# Patient Record
Sex: Male | Born: 1970 | Hispanic: No | Marital: Married | Smoking: Never smoker
Health system: Southern US, Community
[De-identification: ages and names within clinical notes are randomized; demographics above are authoritative.]

## PROBLEM LIST (undated history)

## (undated) DIAGNOSIS — I10 Essential (primary) hypertension: Secondary | ICD-10-CM

---

## 2005-03-17 ENCOUNTER — Observation Stay (HOSPITAL_COMMUNITY): Admission: EM | Admit: 2005-03-17 | Discharge: 2005-03-18 | Payer: Self-pay | Admitting: Emergency Medicine

## 2005-03-20 ENCOUNTER — Emergency Department (HOSPITAL_COMMUNITY): Admission: EM | Admit: 2005-03-20 | Discharge: 2005-03-20 | Payer: Self-pay | Admitting: Emergency Medicine

## 2005-05-09 ENCOUNTER — Ambulatory Visit (HOSPITAL_COMMUNITY): Admission: RE | Admit: 2005-05-09 | Discharge: 2005-05-09 | Payer: Self-pay | Admitting: Urology

## 2005-08-22 ENCOUNTER — Emergency Department (HOSPITAL_COMMUNITY): Admission: EM | Admit: 2005-08-22 | Discharge: 2005-08-22 | Payer: Self-pay | Admitting: Emergency Medicine

## 2006-09-26 ENCOUNTER — Ambulatory Visit: Payer: Self-pay | Admitting: Family Medicine

## 2006-09-27 ENCOUNTER — Ambulatory Visit: Payer: Self-pay | Admitting: *Deleted

## 2007-11-12 ENCOUNTER — Emergency Department (HOSPITAL_COMMUNITY): Admission: EM | Admit: 2007-11-12 | Discharge: 2007-11-12 | Payer: Self-pay | Admitting: Emergency Medicine

## 2008-09-15 ENCOUNTER — Emergency Department (HOSPITAL_COMMUNITY): Admission: EM | Admit: 2008-09-15 | Discharge: 2008-09-16 | Payer: Self-pay | Admitting: Emergency Medicine

## 2009-04-17 ENCOUNTER — Emergency Department (HOSPITAL_COMMUNITY): Admission: EM | Admit: 2009-04-17 | Discharge: 2009-04-18 | Payer: Self-pay | Admitting: Emergency Medicine

## 2009-10-16 ENCOUNTER — Emergency Department (HOSPITAL_COMMUNITY): Admission: EM | Admit: 2009-10-16 | Discharge: 2009-10-16 | Payer: Self-pay | Admitting: Emergency Medicine

## 2010-03-06 ENCOUNTER — Emergency Department (HOSPITAL_COMMUNITY): Admission: EM | Admit: 2010-03-06 | Discharge: 2010-03-06 | Payer: Self-pay | Admitting: Emergency Medicine

## 2010-03-21 ENCOUNTER — Ambulatory Visit (HOSPITAL_COMMUNITY): Admission: RE | Admit: 2010-03-21 | Discharge: 2010-03-21 | Payer: Self-pay | Admitting: Urology

## 2010-06-05 HISTORY — PX: PERCUTANEOUS NEPHROSTOLITHOTOMY: SHX2207

## 2010-06-25 ENCOUNTER — Encounter: Payer: Self-pay | Admitting: Urology

## 2010-08-17 LAB — SURGICAL PCR SCREEN
MRSA, PCR: NEGATIVE
Staphylococcus aureus: NEGATIVE

## 2010-08-18 LAB — URINALYSIS, ROUTINE W REFLEX MICROSCOPIC
Bilirubin Urine: NEGATIVE
Glucose, UA: NEGATIVE mg/dL
Ketones, ur: NEGATIVE mg/dL
Nitrite: NEGATIVE
Protein, ur: 30 mg/dL — AB
Specific Gravity, Urine: 1.014 (ref 1.005–1.030)
Urobilinogen, UA: 0.2 mg/dL (ref 0.0–1.0)
pH: 6.5 (ref 5.0–8.0)

## 2010-08-18 LAB — URINE MICROSCOPIC-ADD ON

## 2010-08-18 LAB — CBC
HCT: 43 % (ref 39.0–52.0)
Hemoglobin: 14.8 g/dL (ref 13.0–17.0)
MCH: 28.7 pg (ref 26.0–34.0)
MCHC: 34.4 g/dL (ref 30.0–36.0)
MCV: 83.3 fL (ref 78.0–100.0)
Platelets: 147 10*3/uL — ABNORMAL LOW (ref 150–400)
RBC: 5.16 MIL/uL (ref 4.22–5.81)
RDW: 12.9 % (ref 11.5–15.5)
WBC: 8.1 10*3/uL (ref 4.0–10.5)

## 2010-08-18 LAB — BASIC METABOLIC PANEL
BUN: 12 mg/dL (ref 6–23)
CO2: 27 mEq/L (ref 19–32)
Calcium: 8.9 mg/dL (ref 8.4–10.5)
Chloride: 106 mEq/L (ref 96–112)
Creatinine, Ser: 0.9 mg/dL (ref 0.4–1.5)
GFR calc Af Amer: >60 mL/min (ref >60)
GFR calc non Af Amer: >60 mL/min (ref >60)
Glucose, Bld: 90 mg/dL (ref 70–99)
Potassium: 3.6 mEq/L (ref 3.5–5.1)
Sodium: 138 mEq/L (ref 135–145)

## 2010-08-18 LAB — URINE CULTURE
Colony Count: NO GROWTH
Culture  Setup Time: 201110021517
Culture: NO GROWTH

## 2010-08-18 LAB — DIFFERENTIAL
Basophils Absolute: 0 10*3/uL (ref 0.0–0.1)
Basophils Relative: 1 % (ref 0–1)
Eosinophils Absolute: 0.5 10*3/uL (ref 0.0–0.7)
Eosinophils Relative: 6 % — ABNORMAL HIGH (ref 0–5)
Lymphocytes Relative: 27 % (ref 12–46)
Lymphs Abs: 2.2 10*3/uL (ref 0.7–4.0)
Monocytes Absolute: 0.6 10*3/uL (ref 0.1–1.0)
Monocytes Relative: 8 % (ref 3–12)
Neutro Abs: 4.8 10*3/uL (ref 1.7–7.7)
Neutrophils Relative %: 59 % (ref 43–77)

## 2010-08-23 LAB — URINE CULTURE
Colony Count: NO GROWTH
Culture: NO GROWTH

## 2010-08-23 LAB — URINALYSIS, ROUTINE W REFLEX MICROSCOPIC
Bilirubin Urine: NEGATIVE
Glucose, UA: NEGATIVE mg/dL
Ketones, ur: NEGATIVE mg/dL
Nitrite: NEGATIVE
Protein, ur: 30 mg/dL — AB
Specific Gravity, Urine: 1.022 (ref 1.005–1.030)
Urobilinogen, UA: 0.2 mg/dL (ref 0.0–1.0)
pH: 6 (ref 5.0–8.0)

## 2010-08-23 LAB — URINE MICROSCOPIC-ADD ON

## 2010-08-23 LAB — DIFFERENTIAL
Basophils Absolute: 0 10*3/uL (ref 0.0–0.1)
Basophils Relative: 0 % (ref 0–1)
Neutro Abs: 4.7 10*3/uL (ref 1.7–7.7)
Neutrophils Relative %: 60 % (ref 43–77)

## 2010-08-23 LAB — POCT I-STAT, CHEM 8
Calcium, Ion: 1.1 mmol/L — ABNORMAL LOW (ref 1.12–1.32)
Chloride: 106 mEq/L (ref 96–112)
HCT: 47 % (ref 39.0–52.0)
Hemoglobin: 16 g/dL (ref 13.0–17.0)
Potassium: 4 mEq/L (ref 3.5–5.1)

## 2010-08-23 LAB — CBC
MCHC: 34.3 g/dL (ref 30.0–36.0)
RDW: 13.1 % (ref 11.5–15.5)

## 2010-09-07 LAB — URINALYSIS, ROUTINE W REFLEX MICROSCOPIC
Ketones, ur: NEGATIVE mg/dL
Nitrite: NEGATIVE
Specific Gravity, Urine: 1.011 (ref 1.005–1.030)
Urobilinogen, UA: 0.2 mg/dL (ref 0.0–1.0)
pH: 6.5 (ref 5.0–8.0)

## 2010-09-07 LAB — POCT I-STAT, CHEM 8
BUN: 9 mg/dL (ref 6–23)
Creatinine, Ser: 0.9 mg/dL (ref 0.4–1.5)
Glucose, Bld: 90 mg/dL (ref 70–99)
Hemoglobin: 16 g/dL (ref 13.0–17.0)
Potassium: 3.8 mEq/L (ref 3.5–5.1)

## 2010-09-07 LAB — URINE CULTURE
Colony Count: NO GROWTH
Culture: NO GROWTH

## 2010-09-07 LAB — URINE MICROSCOPIC-ADD ON

## 2010-10-21 NOTE — H&P (Signed)
NAMEMarland Kitchen  ROC, STREETT       ACCOUNT NO.:  [id number]   MEDICAL RECORD NO.:  [id number]          PATIENT TYPE:  EMS   LOCATION:  MAJO                         FACILITY:  [facility]   PHYSICIAN:  Bertram Millard. Dahlstedt, M.D.DATE OF BIRTH:  01/21/1971   DATE OF ADMISSION:  03/17/2005  DATE OF DISCHARGE:                                HISTORY & PHYSICAL   BRIEF HISTORY:  This 40 year old [place] man, in the [place] for four  months, with no prior knowledge of kidney stones, who is admitted at this  time for bilateral urolithiasis and UTI.  He has been symptomatic for a few  days with dysuria, frequency, and urgency.  He has had intermittent gross  hematuria for a month or two.  He has had intermittent abdominal pain for  the past several weeks to months which has actually intensified recently and  over the past day or two has been fairly significant.  He has had no  discrete fevers.  He has anterior and posterior abdominal pain.  He came to  the emergency room at [facility] where he was diagnosed with UTI  and an abdominal scan revealed a large right staghorn stone, hydronephrosis,  and parenchymal thinning on the right, and a left ureteral stone, distally,  about 5-to-6-mm in size.  Urologic consultation is requested.   PAST MEDICAL HISTORY:  Significant for some sort of intestinal issue in the  past.  He really did not see a doctor regularly in [place].  He denies any  prior knowledge of kidney stones.  He perhaps has had an infection in the  past.   He denies any prior surgical illnesses.   He is not on any medications.   He denies any drug allergies.   He is married and has his first child on the way.  He denies tobacco or  alcohol use.  He works for the [employer] in [employer].   FAMILY HISTORY:  Unremarkable.  His parents are both living and in [place].  He  denies any knowledge of them having heart disease, hypertension, diabetes,  or other  issues.   He denies any longstanding history of gross hematuria, perhaps over the past  month or two.  He denies a slow stream.  He does have some dysuria and  frequency.  He has not had fever.  He has not had nausea.  He does not have  vomiting.  He does not have weight loss.  He denies any shortness of breath.  He has some upper abdominal discomfort which has been diagnosed as  intestinal issues in the past.  He has been on some medicine for that.   PHYSICAL EXAMINATION:  GENERAL:  Pleasant adult male.  He did not speak  [place] but had an interpreter with him who was a relative.  HEENT:  Normal.  NECK:  Supple without thyromegaly or adenopathy.  CHEST: Clear to auscultation.  HEART:  Normal rate and rhythm.  ABDOMEN:  Flat, soft, nondistended, moderate tenderness throughout the  anterior abdomen all quadrants.  No rebound, guarding.  No  hepatosplenomegaly or masses noted.  He had mild  bilateral CVA tenderness.  There were no flank masses.  GENITOURINARY:  Phallus was circumcised.  No lesions.  No fibrotic areas of  plaques.  Glans normal.  Meatus normal in location and size.  No blood or  discharge.  Scrotal skin normal.  Testicles normal in shape, size, and  consistency, descended bilaterally.  Cords and epididymidal structures  normal.  No inguinal hernias.  No peripheral edema.  He had normal DP and PT  pulses in both feet.  SKIN:  Warm and dry.  NEUROLOGIC:  He was alert and oriented .   LABORATORY STUDIES:  A urinalysis was infected.  Hematocrit 39.7%, platelet  count 176,000, white count 5200.  BUN creatinine were 6 and 1.1,  respectively.  Sodium 139, potassium 4.2, chloride 107, bicarb 29, BUN 16,  glucose 88.   I have reviewed the patient's CT scan.  This shows a very large staghorn  right renal calculus with parenchymal thinning throughout.  There perhaps  was some fair parenchyma noted.  He had normal-appearing left kidney with  minimal dilatation of the ureter down  to a distal left ureteral stone which  was 5-to-6-mm in size.  I saw no other abnormalities.   IMPRESSION:  1.  Large right renal calculus/staghorn.  2.  Parenchymal thinning/atrophy right kidney.  3.  Urinary tract infection, perhaps by urea-splitting organism.  4.  Left ureteral calculus.   PLAN:  1.  I will admit the patient tonight and place him on Cipro 400 mg IV q.12h.  2.  I have discussed at length the treatment of the patient's stones.  It is      most important at this point that we un obstruct his good kidney.  I      will take him to the OR tomorrow and place a stent in that left kidney.      If there is an easy stone extraction, I might attempt that.  3.  The patient will eventually need functional studies of the right kidney.      If he has splits less than 15% or so, I think it is worthwhile to plan      on removing the kidney.  If he has got fair function, I think it is      worthwhile to consider percutaneous nephrolithotomy.      Bertram Millard. Dahlstedt, M.D.  Electronically Signed     SMD/MEDQ  D:  03/17/2005  T:  03/17/2005  Job:  [id number]

## 2010-10-21 NOTE — Op Note (Signed)
Paul Kitchen  Ayers, Paul Ayers       ACCOUNT NO.:  [id number]   MEDICAL RECORD NO.:  [id number]          PATIENT TYPE:  OUT   LOCATION:  DAY                          FACILITY:  [facility]   PHYSICIAN:  Bertram Millard. Dahlstedt, M.D.DATE OF BIRTH:  15-Apr-1971   DATE OF PROCEDURE:  DATE OF DISCHARGE:                                 OPERATIVE REPORT   PREOPERATIVE DIAGNOSES:  1.  Urinary tract infection, most likely due to urea-splitting organism.  2.  Left ureteral stone.  3.  Right staghorn stone.   PRINCIPAL PROCEDURES:  Cysto, left double-J stent placement.   ANESTHESIA:  General.   SURGEON:  Dr. Retta Diones.   COMPLICATIONS:  None.   BRIEF HISTORY:  A 40 year old Paul Ayers who presented last night to the  [facility] ER with several-day history of abdominal pain, flank pain, some  low grade fever. He has had intermittent symptoms for quite some time. Due  to language difficulties, it was hard to communicate specifically, although  he does he did have an interpreter.   Evaluation found the patient have a very large staghorn stone on the right,  left ureteral stone in the mid-to-distal ureter on the left, and a urinary  tract infection. Although he was not febrile, the patient was admitted. He  presents at this time for stent placement on the left to relieve any  obstruction, as well as antibiotic management.   I spoke with the patient's family at length. Information was given regarding  the severe nature of his right renal stone, the need for decompression of  the left renal unit, and antibiotic management. I thought it best at this  point to just perform cysto and stent placement as the patient does have an  active UTI, and ureteroscopy would not be warranted at this point due to its  increased risk of sepsis. He presents at this time for left double-J stent  placement.   DESCRIPTION OF PROCEDURE:  The patient had been administered preoperative IV  antibiotics, and he was taken to  the operating room where general anesthetic  was administered. Placed in dorsal lithotomy position. Genitalia and  perineum were prepped and draped. A 22-French panendoscope was advanced into  his bladder. Urethra was normal. Bladder was inspected circumferentially. It  was unremarkable. The left ureter was cannulated with a guidewire which was  advanced under fluoroscopic guidance into the left renal pelvis. Over top of  this, a 6-French x 24 cm double-J stent was placed. Good curls were seen  proximally and distally once the guidewire was removed.   I did take a plain film of the patient's abdomen, showing the large staghorn  stone on the right. I did not see any evidence of calculi along the course  of that right ureter. For this reason, a stent was not placed on that side.   At this point, the bladder was drained, the scope removed and procedure  terminated.   The patient tolerated the procedure well. He was awakened, extubated, taken  to PACU in stable condition.      Bertram Millard. Dahlstedt, M.D.  Electronically Signed     SMD/MEDQ  D:  03/18/2005  T:  03/18/2005  Job:  [id number]

## 2011-03-02 LAB — URINALYSIS, ROUTINE W REFLEX MICROSCOPIC
Nitrite: NEGATIVE
Protein, ur: NEGATIVE
Specific Gravity, Urine: 1.017
Urobilinogen, UA: 0.2

## 2011-03-02 LAB — URINE MICROSCOPIC-ADD ON

## 2011-03-02 LAB — POCT I-STAT, CHEM 8
Creatinine, Ser: 1.3
HCT: 45
Hemoglobin: 15.3
Potassium: 3.8
Sodium: 141
TCO2: 28

## 2011-03-02 LAB — URINE CULTURE
Colony Count: NO GROWTH
Culture: NO GROWTH

## 2014-12-08 DIAGNOSIS — Z87442 Personal history of urinary calculi: Secondary | ICD-10-CM | POA: Insufficient documentation

## 2016-05-25 DIAGNOSIS — M62830 Muscle spasm of back: Secondary | ICD-10-CM | POA: Insufficient documentation

## 2017-11-09 DIAGNOSIS — E119 Type 2 diabetes mellitus without complications: Secondary | ICD-10-CM | POA: Insufficient documentation

## 2017-11-09 DIAGNOSIS — I10 Essential (primary) hypertension: Secondary | ICD-10-CM | POA: Diagnosis present

## 2017-11-09 DIAGNOSIS — R748 Abnormal levels of other serum enzymes: Secondary | ICD-10-CM | POA: Insufficient documentation

## 2018-11-01 DIAGNOSIS — N200 Calculus of kidney: Secondary | ICD-10-CM | POA: Insufficient documentation

## 2018-11-02 DIAGNOSIS — E785 Hyperlipidemia, unspecified: Secondary | ICD-10-CM | POA: Insufficient documentation

## 2019-02-19 ENCOUNTER — Other Ambulatory Visit: Payer: Self-pay

## 2019-02-19 DIAGNOSIS — Z20822 Contact with and (suspected) exposure to covid-19: Secondary | ICD-10-CM

## 2019-02-20 LAB — NOVEL CORONAVIRUS, NAA: SARS-CoV-2, NAA: NOT DETECTED

## 2019-05-06 ENCOUNTER — Other Ambulatory Visit: Payer: Self-pay

## 2019-05-06 DIAGNOSIS — Z20822 Contact with and (suspected) exposure to covid-19: Secondary | ICD-10-CM

## 2019-05-08 LAB — NOVEL CORONAVIRUS, NAA: SARS-CoV-2, NAA: NOT DETECTED

## 2019-06-13 ENCOUNTER — Ambulatory Visit: Payer: Medicaid Other | Attending: Internal Medicine

## 2019-06-13 DIAGNOSIS — Z20822 Contact with and (suspected) exposure to covid-19: Secondary | ICD-10-CM

## 2019-06-14 LAB — NOVEL CORONAVIRUS, NAA: SARS-CoV-2, NAA: NOT DETECTED

## 2021-02-17 ENCOUNTER — Observation Stay (HOSPITAL_COMMUNITY)
Admission: EM | Admit: 2021-02-17 | Discharge: 2021-02-18 | Disposition: A | Discharge status: Home / Self Care | Payer: BC Managed Care – PPO | Attending: Family Medicine | Admitting: Family Medicine

## 2021-02-17 ENCOUNTER — Emergency Department (HOSPITAL_COMMUNITY): Payer: BC Managed Care – PPO

## 2021-02-17 ENCOUNTER — Other Ambulatory Visit: Payer: Self-pay

## 2021-02-17 ENCOUNTER — Encounter (HOSPITAL_COMMUNITY): Payer: Self-pay | Admitting: Internal Medicine

## 2021-02-17 DIAGNOSIS — Z79899 Other long term (current) drug therapy: Secondary | ICD-10-CM | POA: Insufficient documentation

## 2021-02-17 DIAGNOSIS — R0789 Other chest pain: Principal | ICD-10-CM

## 2021-02-17 DIAGNOSIS — Y900 Blood alcohol level of less than 20 mg/100 ml: Secondary | ICD-10-CM | POA: Diagnosis not present

## 2021-02-17 DIAGNOSIS — I10 Essential (primary) hypertension: Secondary | ICD-10-CM | POA: Diagnosis not present

## 2021-02-17 DIAGNOSIS — N179 Acute kidney failure, unspecified: Secondary | ICD-10-CM | POA: Diagnosis not present

## 2021-02-17 DIAGNOSIS — I771 Stricture of artery: Secondary | ICD-10-CM | POA: Diagnosis present

## 2021-02-17 DIAGNOSIS — K4090 Unilateral inguinal hernia, without obstruction or gangrene, not specified as recurrent: Secondary | ICD-10-CM

## 2021-02-17 DIAGNOSIS — R4020 Unspecified coma: Secondary | ICD-10-CM

## 2021-02-17 DIAGNOSIS — Z20822 Contact with and (suspected) exposure to covid-19: Secondary | ICD-10-CM | POA: Insufficient documentation

## 2021-02-17 DIAGNOSIS — R109 Unspecified abdominal pain: Secondary | ICD-10-CM

## 2021-02-17 DIAGNOSIS — R079 Chest pain, unspecified: Secondary | ICD-10-CM | POA: Diagnosis present

## 2021-02-17 DIAGNOSIS — K551 Chronic vascular disorders of intestine: Secondary | ICD-10-CM | POA: Diagnosis present

## 2021-02-17 DIAGNOSIS — R55 Syncope and collapse: Secondary | ICD-10-CM | POA: Diagnosis not present

## 2021-02-17 DIAGNOSIS — I774 Celiac artery compression syndrome: Secondary | ICD-10-CM | POA: Diagnosis present

## 2021-02-17 HISTORY — DX: Essential (primary) hypertension: I10

## 2021-02-17 LAB — CBC WITH DIFFERENTIAL/PLATELET
Abs Immature Granulocytes: 0.05 10*3/uL (ref 0.00–0.07)
Basophils Absolute: 0.1 10*3/uL (ref 0.0–0.1)
Basophils Relative: 1 %
Eosinophils Absolute: 0.3 10*3/uL (ref 0.0–0.5)
Eosinophils Relative: 4 %
HCT: 45.8 % (ref 39.0–52.0)
Hemoglobin: 15.5 g/dL (ref 13.0–17.0)
Immature Granulocytes: 1 %
Lymphocytes Relative: 31 %
Lymphs Abs: 2.9 10*3/uL (ref 0.7–4.0)
MCH: 28.6 pg (ref 26.0–34.0)
MCHC: 33.8 g/dL (ref 30.0–36.0)
MCV: 84.5 fL (ref 80.0–100.0)
Monocytes Absolute: 0.8 10*3/uL (ref 0.1–1.0)
Monocytes Relative: 9 %
Neutro Abs: 5.3 10*3/uL (ref 1.7–7.7)
Neutrophils Relative %: 54 %
Platelets: 170 10*3/uL (ref 150–400)
RBC: 5.42 MIL/uL (ref 4.22–5.81)
RDW: 12 % (ref 11.5–15.5)
WBC: 9.5 10*3/uL (ref 4.0–10.5)
nRBC: 0 % (ref 0.0–0.2)

## 2021-02-17 LAB — COMPREHENSIVE METABOLIC PANEL
ALT: 56 U/L — ABNORMAL HIGH (ref 0–44)
AST: 27 U/L (ref 15–41)
Albumin: 3.6 g/dL (ref 3.5–5.0)
Alkaline Phosphatase: 57 U/L (ref 38–126)
Anion gap: 8 (ref 5–15)
BUN: 16 mg/dL (ref 6–20)
CO2: 28 mmol/L (ref 22–32)
Calcium: 8.8 mg/dL — ABNORMAL LOW (ref 8.9–10.3)
Chloride: 100 mmol/L (ref 98–111)
Creatinine, Ser: 1.28 mg/dL — ABNORMAL HIGH (ref 0.61–1.24)
GFR, Estimated: >60 mL/min (ref >60)
Glucose, Bld: 162 mg/dL — ABNORMAL HIGH (ref 70–99)
Potassium: 3.5 mmol/L (ref 3.5–5.1)
Sodium: 136 mmol/L (ref 135–145)
Total Bilirubin: 0.9 mg/dL (ref 0.3–1.2)
Total Protein: 6.9 g/dL (ref 6.5–8.1)

## 2021-02-17 LAB — URINALYSIS, ROUTINE W REFLEX MICROSCOPIC
Bilirubin Urine: NEGATIVE
Glucose, UA: NEGATIVE mg/dL
Hgb urine dipstick: NEGATIVE
Ketones, ur: NEGATIVE mg/dL
Leukocytes,Ua: NEGATIVE
Nitrite: NEGATIVE
Protein, ur: NEGATIVE mg/dL
Specific Gravity, Urine: 1.035 — ABNORMAL HIGH (ref 1.005–1.030)
pH: 6 (ref 5.0–8.0)

## 2021-02-17 LAB — TROPONIN I (HIGH SENSITIVITY)
Troponin I (High Sensitivity): 6 ng/L (ref <18)
Troponin I (High Sensitivity): 4 ng/L (ref <18)

## 2021-02-17 LAB — I-STAT CHEM 8, ED
BUN: 21 mg/dL — ABNORMAL HIGH (ref 6–20)
Calcium, Ion: 1.06 mmol/L — ABNORMAL LOW (ref 1.15–1.40)
Chloride: 102 mmol/L (ref 98–111)
Creatinine, Ser: 1.3 mg/dL — ABNORMAL HIGH (ref 0.61–1.24)
Glucose, Bld: 159 mg/dL — ABNORMAL HIGH (ref 70–99)
HCT: 43 % (ref 39.0–52.0)
Hemoglobin: 14.6 g/dL (ref 13.0–17.0)
Potassium: 3.5 mmol/L (ref 3.5–5.1)
Sodium: 137 mmol/L (ref 135–145)
TCO2: 27 mmol/L (ref 22–32)

## 2021-02-17 LAB — HEMOGLOBIN A1C
Hgb A1c MFr Bld: 5.8 % — ABNORMAL HIGH (ref 4.8–5.6)
Mean Plasma Glucose: 119.76 mg/dL

## 2021-02-17 LAB — RAPID URINE DRUG SCREEN, HOSP PERFORMED
Amphetamines: NOT DETECTED
Barbiturates: NOT DETECTED
Benzodiazepines: NOT DETECTED
Cocaine: NOT DETECTED
Opiates: POSITIVE — AB
Tetrahydrocannabinol: NOT DETECTED

## 2021-02-17 LAB — ETHANOL: Alcohol, Ethyl (B): <10 mg/dL (ref <10)

## 2021-02-17 LAB — RESP PANEL BY RT-PCR (FLU A&B, COVID) ARPGX2
Influenza A by PCR: NEGATIVE
Influenza B by PCR: NEGATIVE
SARS Coronavirus 2 by RT PCR: NEGATIVE

## 2021-02-17 LAB — APTT: aPTT: 24 seconds (ref 24–36)

## 2021-02-17 LAB — PROTIME-INR
INR: 1 (ref 0.8–1.2)
Prothrombin Time: 13.5 seconds (ref 11.4–15.2)

## 2021-02-17 LAB — LIPASE, BLOOD: Lipase: 34 U/L (ref 11–51)

## 2021-02-17 LAB — AMMONIA: Ammonia: 25 umol/L (ref 9–35)

## 2021-02-17 LAB — LACTIC ACID, PLASMA: Lactic Acid, Venous: 1.5 mmol/L (ref 0.5–1.9)

## 2021-02-17 MED ORDER — HEPARIN SODIUM (PORCINE) 5000 UNIT/ML IJ SOLN: 4000.0000 [IU] | Freq: Once | INTRAMUSCULAR | Status: DC

## 2021-02-17 MED ORDER — ONDANSETRON HCL 4 MG/2ML IJ SOLN: 4.0000 mg | Freq: Four times a day (QID) | INTRAMUSCULAR | Status: DC | PRN

## 2021-02-17 MED ORDER — IOHEXOL 350 MG/ML SOLN
100.0000 mL | Freq: Once | INTRAVENOUS | Status: AC | PRN
  Administered 2021-02-17: 100 mL via INTRAVENOUS

## 2021-02-17 MED ORDER — SENNOSIDES-DOCUSATE SODIUM 8.6-50 MG PO TABS: 1.0000 | ORAL_TABLET | Freq: Every evening | ORAL | Status: DC | PRN

## 2021-02-17 MED ORDER — SODIUM CHLORIDE 0.9 % IV BOLUS
1000.0000 mL | Freq: Once | INTRAVENOUS | Status: AC
  Administered 2021-02-17: 1000 mL via INTRAVENOUS

## 2021-02-17 MED ORDER — ACETAMINOPHEN 500 MG PO TABS: 1000.0000 mg | ORAL_TABLET | Freq: Four times a day (QID) | ORAL | Status: DC | PRN

## 2021-02-17 MED ORDER — ACETAMINOPHEN 650 MG RE SUPP: 650.0000 mg | Freq: Four times a day (QID) | RECTAL | Status: DC | PRN

## 2021-02-17 MED ORDER — SODIUM CHLORIDE 0.9 % IV SOLN: INTRAVENOUS | Status: AC

## 2021-02-17 MED ORDER — ONDANSETRON HCL 4 MG PO TABS: 4.0000 mg | ORAL_TABLET | Freq: Four times a day (QID) | ORAL | Status: DC | PRN

## 2021-02-17 MED ORDER — MORPHINE SULFATE (PF) 4 MG/ML IV SOLN
4.0000 mg | Freq: Once | INTRAVENOUS | Status: AC
  Administered 2021-02-17: 4 mg via INTRAVENOUS
  Filled 2021-02-17: qty 1

## 2021-02-17 MED ORDER — SODIUM CHLORIDE 0.9 % IV SOLN: INTRAVENOUS | Status: DC

## 2021-02-17 MED ORDER — ENOXAPARIN SODIUM 40 MG/0.4ML IJ SOSY
40.0000 mg | PREFILLED_SYRINGE | INTRAMUSCULAR | Status: DC
  Administered 2021-02-18: 40 mg via SUBCUTANEOUS
  Filled 2021-02-17: qty 0.4

## 2021-02-17 MED ORDER — ASPIRIN 81 MG PO CHEW: 324.0000 mg | CHEWABLE_TABLET | Freq: Once | ORAL | Status: DC

## 2021-02-17 MED ORDER — SODIUM CHLORIDE 0.9% FLUSH
3.0000 mL | Freq: Two times a day (BID) | INTRAVENOUS | Status: DC
  Administered 2021-02-18 (×2): 3 mL via INTRAVENOUS

## 2021-02-17 MED ORDER — ONDANSETRON HCL 4 MG/2ML IJ SOLN
4.0000 mg | Freq: Once | INTRAMUSCULAR | Status: AC
  Administered 2021-02-17: 4 mg via INTRAVENOUS
  Filled 2021-02-17: qty 2

## 2021-02-17 NOTE — Progress Notes (Signed)
Reviewed EKG by EMS and EKG performed in the ED. No STEMI.   Paul Negus, MD Pager: [phone] Office: [phone]

## 2021-02-17 NOTE — ED Notes (Signed)
Patient is resting comfortably. 

## 2021-02-17 NOTE — ED Provider Notes (Signed)
[facility] EMERGENCY DEPARTMENT Provider Note   CSN: [id number] Arrival date & time: 02/17/21  1527     History No chief complaint on file.   Paul Ayers is a 50 y.o. male.  50 yo M with a cc of chest pain and syncope.  The patient is currently incarcerated and while in jail he was complaining of chest pain this morning he was given nitroglycerin and aspirin and about an hour later had a syncopal event.  EMS was called and he was found to be pale cool and diaphoretic they felt like he had evolving ST changes did not meet the patient to STEMI upon arrival.  Level 5 caveat acuity of condition.       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 Authorizing Provider  hydrochlorothiazide (HYDRODIURIL) 25 MG tablet Take 25 mg by mouth daily.   Yes [provider]  lisinopril (ZESTRIL) 20 MG tablet Take 20 mg by mouth daily.   Yes [provider]    Allergies    Patient has no known allergies.  Review of Systems   Review of Systems  Unable to perform ROS: Acuity of condition   Physical Exam Updated Vital Signs BP (!) 121/91   Pulse 95   Temp 97.9 F (36.6 C) (Oral)   Resp 19   Ht 5\' 8"  (1.727 m)   Wt  77.1 kg   SpO2 98%   BMI 25.85 kg/m   Physical Exam Vitals and nursing note reviewed.  Constitutional:      Appearance: He is well-developed.  HENT:     Head: Normocephalic and atraumatic.  Eyes:     Pupils: Pupils are equal, round, and reactive to light.  Neck:     Vascular: No JVD.  Cardiovascular:     Rate and Rhythm: Normal rate and regular rhythm.     Heart sounds: No murmur heard.   No friction rub. No gallop.  Pulmonary:     Effort: No respiratory distress.     Breath sounds: No wheezing.  Abdominal:     General: There is no distension.     Tenderness: There is no abdominal tenderness. There is no guarding or rebound.  Musculoskeletal:        General: Normal range of motion.     Cervical back: Normal range of motion and neck supple.  Skin:    Coloration: Skin is not pale.     Findings: No rash.  Neurological:     Mental Status: He is alert.     Comments: Patient is not speaking, nods yes or turns head no.  Moving all extremities spontaneously.  Global weakness.  Psychiatric:  Behavior: Behavior normal.    ED Results / Procedures / Treatments   Labs (all labs ordered are listed, but only abnormal results are displayed) Labs Reviewed  HEMOGLOBIN A1C - Abnormal; Notable for the following components:      Result Value   Hgb A1c MFr Bld 5.8 (*)    All other components within normal limits  COMPREHENSIVE METABOLIC PANEL - Abnormal; Notable for the following components:   Glucose, Bld 162 (*)    Creatinine, Ser 1.28 (*)    Calcium 8.8 (*)    ALT 56 (*)    All other components within normal limits  URINALYSIS, ROUTINE W REFLEX MICROSCOPIC - Abnormal; Notable for the following components:   Specific Gravity, Urine 1.035 (*)    All other components within normal limits  RAPID URINE DRUG SCREEN, HOSP PERFORMED - Abnormal; Notable for the following components:   Opiates POSITIVE (*)    All other components within normal limits  I-STAT CHEM 8, ED - Abnormal;  Notable for the following components:   BUN 21 (*)    Creatinine, Ser 1.30 (*)    Glucose, Bld 159 (*)    Calcium, Ion 1.06 (*)    All other components within normal limits  RESP PANEL BY RT-PCR (FLU A&B, COVID) ARPGX2  CBC WITH DIFFERENTIAL/PLATELET  PROTIME-INR  APTT  LIPASE, BLOOD  ETHANOL  AMMONIA  LIPID PANEL  LACTIC ACID, PLASMA  HIV ANTIBODY (ROUTINE TESTING W REFLEX)  MAGNESIUM  BASIC METABOLIC PANEL  CBC  TROPONIN I (HIGH SENSITIVITY)  TROPONIN I (HIGH SENSITIVITY)    EKG EKG Interpretation  Date/Time:  Thursday February 17 2021 15:38:01 EDT Ventricular Rate:  82 PR Interval:  181 QRS Duration: 101 QT Interval:  397 QTC Calculation: 464 R Axis:   44 Text Interpretation: Sinus rhythm ST elev, probable normal early repol pattern Otherwise no significant change Confirmed by Melene Plan [phone]) on 02/17/2021 4:06:19 PM  Radiology CT HEAD WO CONTRAST ( )  Result Date: 02/17/2021 CLINICAL DATA:  Delirium EXAM: CT HEAD WITHOUT CONTRAST TECHNIQUE: Contiguous axial images were obtained from the base of the skull through the vertex without intravenous contrast. COMPARISON:  None. FINDINGS: Brain: There is no acute intracranial hemorrhage, extra-axial fluid collection, or infarct. The ventricles are normal in size. There is no mass lesion. There is no midline shift. Vascular: No hyperdense vessel or unexpected calcification. Skull: Normal. Negative for fracture or focal lesion. Sinuses/Orbits: There is mucosal thickening in the right sphenoid sinus and left maxillary sinus. The globes and orbits are unremarkable. Other: None. IMPRESSION: No acute intracranial pathology. Electronically Signed   By: Lesia Hausen M.D.   On: 02/17/2021 17:01   CT Angio Chest/Abd/Pel for Dissection W and/or Wo Contrast  Result Date: 02/17/2021 CLINICAL DATA:  Chest and back pain. EXAM: CT ANGIOGRAPHY CHEST, ABDOMEN AND PELVIS TECHNIQUE: Non-contrast CT of the chest was initially obtained.  Multidetector CT imaging through the chest, abdomen and pelvis was performed using the standard protocol during bolus administration of intravenous contrast. Multiplanar reconstructed images and MIPs were obtained and reviewed to evaluate the vascular anatomy. CONTRAST:  OMNIPAQUE IOHEXOL 350 MG/ML SOLN COMPARISON:  March 06, 2010.  Oct 16, 2009.  April 17, 2009. FINDINGS: CTA CHEST FINDINGS Cardiovascular: Preferential opacification of the thoracic aorta. No evidence of thoracic aortic aneurysm or dissection. Normal heart size. No pericardial effusion. Mediastinum/Nodes: No enlarged mediastinal, hilar, or axillary lymph nodes. Thyroid gland, trachea, and esophagus demonstrate no significant findings. Lungs/Pleura: Lungs are clear. No pleural  effusion or pneumothorax. Musculoskeletal: No chest wall abnormality. No acute or significant osseous findings. Review of the MIP images confirms the above findings. CTA ABDOMEN AND PELVIS FINDINGS VASCULAR Aorta: No evidence of abdominal aortic aneurysm or dissection is noted. Celiac: Severe stenosis is seen involving the proximal trunk of the celiac artery with poststenotic dilatation. No thrombus is noted. SMA: Severe stenosis is noted involving the proximal trunk of the superior mesenteric artery with poststenotic dilatation. No thrombus is noted. Renals: Left renal artery is widely patent without thrombus dissection or stenosis. Right renal artery is diffusely diminished in caliber consistent with right renal atrophy. IMA: Patent without evidence of aneurysm, dissection, vasculitis or significant stenosis. Inflow: Patent without evidence of aneurysm, dissection, vasculitis or significant stenosis. Veins: No obvious venous abnormality within the limitations of this arterial phase study. Review of the MIP images confirms the above findings. NON-VASCULAR Hepatobiliary: No gallstones or biliary dilatation is noted. Left hepatic cyst is noted which is enlarged  compared to prior exam. Pancreas: Unremarkable. No pancreatic ductal dilatation or surrounding inflammatory changes. Spleen: Normal in size without focal abnormality. Adrenals/Urinary Tract: Adrenal glands appear normal. Left kidney is unremarkable. Multiple large right renal calculi are noted. Multiple fluid-filled structures are seen involving the right kidney and are replacing most of the parenchyma. It is uncertain if these represent renal cyst, or severe caliectasis due to chronic stenoses related to prior nephrolithiasis. Urinary bladder is unremarkable. Stomach/Bowel: Stomach is within normal limits. Appendix appears normal. No evidence of bowel wall thickening, distention, or inflammatory changes. Lymphatic: No significant adenopathy is noted. Reproductive: Prostate is unremarkable. Other: Large right inguinal hernia is noted which contains fluid. No ascites is noted. Musculoskeletal: No acute or significant osseous findings. Review of the MIP images confirms the above findings. IMPRESSION: No evidence of thoracic or abdominal aortic aneurysm or dissection. Severe stenoses are seen involving the origins of the celiac and superior mesenteric arteries suggesting chronic mesenteric ischemia. 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 Multiple fluid-filled rounded abnormalities are noted concerning for enlarged cysts or dilated calices secondary to infundibular scarring from prior nephrolithiasis. Large right inguinal hernia is noted which contains fluid. Electronically Signed   By: Lupita Raider M.D.   On: 02/17/2021 17:25    Procedures Procedures   Medications Ordered in ED Medications  0.9 %  sodium chloride infusion ( Intravenous New Bag/Given 02/17/21 1918)  sodium chloride flush (NS) 0.9 % injection 3 mL (has no administration in time range)  enoxaparin (LOVENOX) injection 40 mg (has no administration in time range)  acetaminophen (TYLENOL)  tablet 1,000 mg (has no administration in time range)    Or  acetaminophen (TYLENOL) suppository 650 mg (has no administration in time range)  senna-docusate (Senokot-S) tablet 1 tablet (has no administration in time range)  ondansetron (ZOFRAN) tablet 4 mg (has no administration in time range)    Or  ondansetron (ZOFRAN) injection 4 mg (has no administration in time range)  0.9 %  sodium chloride infusion (has no administration in time range)  sodium chloride 0.9 % bolus 1,000 mL (0 mLs Intravenous Stopped 02/17/21 1926)  morphine 4 MG/ML injection 4 mg (4 mg Intravenous Given 02/17/21 1610)  ondansetron (ZOFRAN) injection 4 mg (4 mg Intravenous Given 02/17/21 1610)  iohexol (OMNIPAQUE) 350 MG/ML injection 100 mL (100 mLs Intravenous Contrast Given 02/17/21 1640)    ED Course  I have reviewed the triage vital signs and the nursing notes.  Pertinent labs &  imaging results that were available during my care of the patient were reviewed by me and considered in my medical decision making (see chart for details).    MDM Rules/Calculators/A&P                           50 yo F with a chief complaints of chest pain and syncope.  This occurred while he was in prison.  Concern for STEMI was activated in the field.  Upon arrival the patient is nonverbal complaining of chest pain by nodding.  Cardiology at bedside soon after arrival.  With atypical presentation and EKG not consistent with STEMI will wait for further medical evaluation.  Patient started complaining of right lower quadrant abdominal discomfort.  Has a right inguinal hernia that is easily reducible.  Pain to the right lower quadrant.  With migrating pain and syncope concern for dissection.  Will obtain a CT scan of the head as patient is nonverbal will obtain a CT scan chest abdomen pelvis.  Blood work.  Reassess.  CT scan without acute pathology.  Blood work is largely unremarkable.  2 troponins are negative.  UA negative for  infection.  Patient's mental status is improved.  Tells me that he has been having left lateral chest pain off and on since this morning.  Had seen the provider at jail and was given nitroglycerin and aspirin.  When he went back to see them about an hour later he was told to leave and come back.  He went to use the phone and felt like his vision got dark and he went to get a guard and collapsed to the ground.  He had a very prolonged period of time where he was largely unresponsive and unable to speak.  Symptoms not completely typical of vasovagal syncope.  Will discuss with medicine for possible admission.  CRITICAL CARE Performed by: Rae Roam   Total critical care time: 35 minutes  Critical care time was exclusive of separately billable procedures and treating other patients.  Critical care was necessary to treat or prevent imminent or life-threatening deterioration.  Critical care was time spent personally by me on the following activities: development of treatment plan with patient and/or surrogate as well as nursing, discussions with consultants, evaluation of patient's response to treatment, examination of patient, obtaining history from patient or surrogate, ordering and performing treatments and interventions, ordering and review of laboratory studies, ordering and review of radiographic studies, pulse oximetry and re-evaluation of patient's condition.  The patients results and plan were reviewed and discussed.   Any x-rays performed were independently reviewed by myself.   Differential diagnosis were considered with the presenting HPI.  Medications  0.9 %  sodium chloride infusion ( Intravenous New Bag/Given 02/17/21 1918)  sodium chloride flush (NS) 0.9 % injection 3 mL (has no administration in time range)  enoxaparin (LOVENOX) injection 40 mg (has no administration in time range)  acetaminophen (TYLENOL) tablet 1,000 mg (has no administration in time range)    Or   acetaminophen (TYLENOL) suppository 650 mg (has no administration in time range)  senna-docusate (Senokot-S) tablet 1 tablet (has no administration in time range)  ondansetron (ZOFRAN) tablet 4 mg (has no administration in time range)    Or  ondansetron (ZOFRAN) injection 4 mg (has no administration in time range)  0.9 %  sodium chloride infusion (has no administration in time range)  sodium chloride 0.9 % bolus 1,000 mL (0 mLs  Intravenous Stopped 02/17/21 1926)  morphine 4 MG/ML injection 4 mg (4 mg Intravenous Given 02/17/21 1610)  ondansetron (ZOFRAN) injection 4 mg (4 mg Intravenous Given 02/17/21 1610)  iohexol (OMNIPAQUE) 350 MG/ML injection 100 mL (100 mLs Intravenous Contrast Given 02/17/21 1640)    Vitals:   02/17/21 1900 02/17/21 1945 02/17/21 2015 02/17/21 2045  BP: (!) 119/91 (!) 139/104 (!) 124/95 (!) 121/91  Pulse: 97 100 95 95  Resp: Temp:      TempSrc:      SpO2: 98% 100% 98% 98%  Weight:      Height:        Final diagnoses:  Loss of consciousness (HCC)  Flank pain  Chest pain, unspecified type    Admission/ observation were discussed with the admitting physician, patient and/or family and they are comfortable with the plan.    Final Clinical Impression(s) / ED Diagnoses Final diagnoses:  Loss of consciousness (HCC)  Flank pain  Chest pain, unspecified type    Rx / DC Orders ED Discharge Orders     None        Melene Plan, DO 02/17/21 2239

## 2021-02-17 NOTE — ED Notes (Signed)
Pt BIB EMS for c/o CP unrelieved by ASAand nitro, pt was pale upon EMS arrival, and CP was worse, pt was vomiting, EMS has given him 324 asa

## 2021-02-17 NOTE — ED Notes (Signed)
ED Provider at bedside. 

## 2021-02-17 NOTE — H&P (Signed)
History and Physical    Paul Ayers MVE:[id number] DOB: [birth year] DOA: 02/17/2021  PCP: Pcp, No  Patient coming from: [place] via EMS  I have personally briefly reviewed patient's old medical records in [place]  Chief Complaint: Chest and abdominal pain  HPI: Paul Ayers is a 50 y.o. male with medical history significant for hypertension, bilateral inguinal hernias, nephrolithiasis including large right staghorn calculi requiring 5 stage PCNL who presented to the ED for evaluation of chest pain.  Patient is currently incarcerated.  He says yesterday he developed left upper quadrant abdominal pain.  Today he began to have left-sided chest pain with radiation to his left shoulder.  He saw the prison nurse.  He says he was given an aspirin and sublingual nitroglycerin and advised to return to his cell.  He states that he went to use the phone afterwards to call family.  While standing LP began to have dark vision and then tried to sit down before collapsing to the ground.  Per EDP documentation he had a reported prolonged period of time where he was largely unresponsive and unable to speak.  Patient also reports chronic right groin pain related to his inguinal hernia.  Also reports intermittent right flank pain related to kidney stones.  Currently he states that he is feeling better but still having some vague upper abdominal pain in addition to his right groin pain.  He denies any recent dyspnea.  He did have an episode of emesis earlier today.  He denies any diarrhea, dysuria, or blood in stools or other obvious bleeding.  He denies any history of tobacco use.  He reports drinking a beer occasionally.  He denies any illicit drug use.  He says that his brother has a history of seizures.  ED Course:  Initial vitals showed BP 107/84, pulse 81, RR 19, temp 97.9 F, SPO2 97% on room air.  Labs show sodium 136, potassium 3.5, bicarb 28, BUN 16, creatinine 1.28,  serum glucose 162, AST 27, ALT 56, alk phos 57, total bilirubin 0.9, INR 1.0, lipase 34, hemoglobin A1c 5.8.  High-sensitivity troponin I 6 >> 4.  WBC 9.5, hemoglobin 15.5, platelets 170,000.  Ammonia 25, serum ethanol <10.  Urinalysis negative for UTI.  UDS positive for opiates (after receiving IV morphine).  SARS-CoV-2 and influenza PCR's are negative.  CT head without contrast was negative for acute intracranial pathology.  CTA chest/abdomen/pelvis was negative for evidence of thoracic or abdominal aortic aneurysm or dissection.  Severe stenoses involving origins of celiac and superior mesenteric arteries are seen.  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  Multiple fluid-filled rounded abnormalities concerning for enlarged cyst or dilated calyces secondary to infundibular scarring from prior nephrolithiasis also reported.  Large right inguinal hernia is noted which contains fluid.  Patient initially arrived via EMS as code STEMI.  Cardiology reviewed EKG, no STEMI.  Patient was given 1 L normal saline, 4 mg IV morphine, IV Zofran.  Patient initially nonverbal per EDP, improved after receiving fluids.  Given questionable syncopal episode associated with chest and abdominal pain the hospitalist service was consulted to admit for further evaluation and management.  Review of Systems: All systems reviewed and are negative except as documented in history of present illness above.   Past Medical History:  Diagnosis Date   Hypertension     Past Surgical History:  Procedure Laterality Date   PERCUTANEOUS NEPHROSTOLITHOTOMY  2012    Social History:  reports that he has never smoked. He has never used smokeless tobacco. He reports that he does not use drugs. No history on file for alcohol use.  No Known Allergies  Family History  Problem Relation Age of Onset   Seizures Brother      Prior to Admission medications   Not on File     Physical Exam: Vitals:   02/17/21 1900 02/17/21 1945 02/17/21 2015 02/17/21 2045  BP: (!) 119/91 (!) 139/104 (!) 124/95 (!) 121/91  Pulse: 97 100 95 95  Resp: _0 Temp:      TempSrc:      SpO2: 98% 100% 98% 98%  Weight:      Height:       Constitutional: Resting supine in bed, NAD, calm, comfortable Eyes: PERRL, lids and conjunctivae normal ENMT: Mucous membranes are moist. Posterior pharynx clear of any exudate or lesions.Normal dentition.  Neck: normal, supple, no masses. Respiratory: clear to auscultation bilaterally, no wheezing, no crackles. Normal respiratory effort. No accessory muscle use.  Cardiovascular: Regular rate and rhythm, no murmurs / rubs / gallops. No extremity edema. 2+ pedal pulses. Abdomen: Mild generalized tenderness.  Large right inguinal hernia present, soft and reducible. No hepatosplenomegaly. Bowel sounds positive.  Musculoskeletal: no clubbing / cyanosis. No joint deformity upper and lower extremities. Good ROM, no contractures. Normal muscle tone.  Skin: no rashes, lesions, ulcers. No induration Neurologic: CN 2-12 grossly intact. Sensation intact. Strength 5/5 in all 4.  Psychiatric: Alert and oriented x 3. Normal mood.   Labs on Admission: I have personally reviewed following labs and imaging studies  CBC: Recent Labs  Lab 02/17/21 1535 02/17/21 1546  WBC 9.5  --   NEUTROABS 5.3  --   HGB 15.5 14.6  HCT 45.8 43.0  MCV 84.5  --   PLT 170  --    Basic Metabolic Panel: Recent Labs  Lab 02/17/21 1535 02/17/21 1546  NA 136 137  K 3.5 3.5  CL 100 102  CO2 28  --   GLUCOSE 162* 159*  BUN 16 21*  CREATININE 1.28* 1.30*  CALCIUM 8.8*  --    GFR: Estimated Creatinine Clearance: 67.2 mL/min (A) (by C-G formula based on SCr of 1.3 mg/dL (H)). Liver Function Tests: Recent Labs  Lab 02/17/21 1535  AST 27  ALT 56*  ALKPHOS 57  BILITOT 0.9  PROT 6.9  ALBUMIN 3.6   Recent Labs  Lab 02/17/21 1535  LIPASE 34   Recent  Labs  Lab 02/17/21 1544  AMMONIA 25   Coagulation Profile: Recent Labs  Lab 02/17/21 1535  INR 1.0   Cardiac Enzymes: No results for input(s): CKTOTAL, CKMB, CKMBINDEX, TROPONINI in the last 168 hours. BNP (last 3 results) No results for input(s): PROBNP in the last 8760 hours. HbA1C: Recent Labs    02/17/21 1535  HGBA1C 5.8*   CBG: No results for input(s): GLUCAP in the last 168 hours. Lipid Profile: No results for input(s): CHOL, HDL, LDLCALC, TRIG, CHOLHDL, LDLDIRECT in the last 72 hours. Thyroid Function Tests: No results for input(s): TSH, T4TOTAL, FREET4, T3FREE, THYROIDAB in the last 72 hours. Anemia Panel: No results for input(s): VITAMINB12, FOLATE, FERRITIN, TIBC, IRON, RETICCTPCT in the last 72 hours. Urine analysis:    Component Value Date/Time   COLORURINE YELLOW 02/17/2021 1917   APPEARANCEUR CLEAR 02/17/2021 1917   LABSPEC 1.035 (H) 02/17/2021 1917   PHURINE 6.0 02/17/2021 1917   GLUCOSEU NEGATIVE 02/17/2021 1917   HGBUR NEGATIVE  02/17/2021 [place] 02/17/2021 [place] 02/17/2021 [place] NEGATIVE 02/17/2021 1917   UROBILINOGEN 0.2 03/06/2010 0431   NITRITE NEGATIVE 02/17/2021 1917   LEUKOCYTESUR NEGATIVE 02/17/2021 1917    Radiological Exams on Admission: CT HEAD WO CONTRAST (5MM)  Result Date: 02/17/2021 CLINICAL DATA:  Delirium EXAM: CT HEAD WITHOUT CONTRAST TECHNIQUE: Contiguous axial images were obtained from the base of the skull through the vertex without intravenous contrast. COMPARISON:  None. FINDINGS: Brain: There is no acute intracranial hemorrhage, extra-axial fluid collection, or infarct. The ventricles are normal in size. There is no mass lesion. There is no midline shift. Vascular: No hyperdense vessel or unexpected calcification. Skull: Normal. Negative for fracture or focal lesion. Sinuses/Orbits: There is mucosal thickening in the right sphenoid sinus and left maxillary sinus. The globes and  orbits are unremarkable. Other: None. IMPRESSION: No acute intracranial pathology. Electronically Signed   By: Valetta Mole M.D.   On: 02/17/2021 17:01   CT Angio Chest/Abd/Pel for Dissection W and/or Wo Contrast  Result Date: 02/17/2021 CLINICAL DATA:  Chest and back pain. EXAM: CT ANGIOGRAPHY CHEST, ABDOMEN AND PELVIS TECHNIQUE: Non-contrast CT of the chest was initially obtained. Multidetector CT imaging through the chest, abdomen and pelvis was performed using the standard protocol during bolus administration of intravenous contrast. Multiplanar reconstructed images and MIPs were obtained and reviewed to evaluate the vascular anatomy. CONTRAST:  178m OMNIPAQUE IOHEXOL 350 MG/ML SOLN COMPARISON:  March 06, 2010.  Oct 16, 2009.  April 17, 2009. FINDINGS: CTA CHEST FINDINGS Cardiovascular: Preferential opacification of the thoracic aorta. No evidence of thoracic aortic aneurysm or dissection. Normal heart size. No pericardial effusion. Mediastinum/Nodes: No enlarged mediastinal, hilar, or axillary lymph nodes. Thyroid gland, trachea, and esophagus demonstrate no significant findings. Lungs/Pleura: Lungs are clear. No pleural effusion or pneumothorax. Musculoskeletal: No chest wall abnormality. No acute or significant osseous findings. Review of the MIP images confirms the above findings. CTA ABDOMEN AND PELVIS FINDINGS VASCULAR Aorta: No evidence of abdominal aortic aneurysm or dissection is noted. Celiac: Severe stenosis is seen involving the proximal trunk of the celiac artery with poststenotic dilatation. No thrombus is noted. SMA: Severe stenosis is noted involving the proximal trunk of the superior mesenteric artery with poststenotic dilatation. No thrombus is noted. Renals: Left renal artery is widely patent without thrombus dissection or stenosis. Right renal artery is diffusely diminished in caliber consistent with right renal atrophy. IMA: Patent without evidence of aneurysm, dissection,  vasculitis or significant stenosis. Inflow: Patent without evidence of aneurysm, dissection, vasculitis or significant stenosis. Veins: No obvious venous abnormality within the limitations of this arterial phase study. Review of the MIP images confirms the above findings. NON-VASCULAR Hepatobiliary: No gallstones or biliary dilatation is noted. Left hepatic cyst is noted which is enlarged compared to prior exam. Pancreas: Unremarkable. No pancreatic ductal dilatation or surrounding inflammatory changes. Spleen: Normal in size without focal abnormality. Adrenals/Urinary Tract: Adrenal glands appear normal. Left kidney is unremarkable. Multiple large right renal calculi are noted. Multiple fluid-filled structures are seen involving the right kidney and are replacing most of the parenchyma. It is uncertain if these represent renal cyst, or severe caliectasis due to chronic stenoses related to prior nephrolithiasis. Urinary bladder is unremarkable. Stomach/Bowel: Stomach is within normal limits. Appendix appears normal. No evidence of bowel wall thickening, distention, or inflammatory changes. Lymphatic: No significant adenopathy is noted. Reproductive: Prostate is unremarkable. Other: Large right inguinal hernia is noted which contains fluid. No ascites is  noted. Musculoskeletal: No acute or significant osseous findings. Review of the MIP images confirms the above findings. IMPRESSION: No evidence of thoracic or abdominal aortic aneurysm or dissection. Severe stenoses are seen involving the origins of the celiac and superior mesenteric arteries suggesting chronic mesenteric ischemia. 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 Multiple fluid-filled rounded abnormalities are noted concerning for enlarged cysts or dilated calices secondary to infundibular scarring from prior nephrolithiasis. Large right inguinal hernia is noted which contains fluid. Electronically  Signed   By: Marijo Conception M.D.   On: 02/17/2021 17:25    EKG: Personally reviewed. Normal sinus rhythm, early repolarization changes.  No prior for comparison.  Assessment/Plan Principal Problem:   Chest pain Active Problems:   Hypertension, benign essential, goal below 140/90   AKI (acute kidney injury) ([place])   Right inguinal hernia   Celiac artery stenosis ([place])   Superior mesenteric artery stenosis ([place])   Kiegan Imani Roshankouhi is a 50 y.o. male with medical history significant for hypertension, bilateral inguinal hernias, nephrolithiasis including large right staghorn calculi requiring 5 stage PCNL who is admitted for evaluation of syncopal episode, chest/abdominal pain.  Syncopal episode: Had reported syncopal episode with prolonged unresponsive period but no indication that he lost pulse or required ACLS protocol.  Possibly hypotensive episode after receiving nitroglycerin. -Check orthostatic vitals -Start IV fluid hydration overnight -Keep on telemetry, obtain echocardiogram -Hold lisinopril and HCTZ  Chest/abdominal pain: Patient with atypical chest pain.  Doubt ACS as troponins are negative x2, EKG without acute ischemic changes or STEMI.  Has chronic right flank and groin pain related to renal stones and known inguinal hernia.  CT findings show severe stenoses at the origins of the celiac and superior mesenteric arteries suggesting chronic mesenteric ischemia which may be contributing to his pain with possible acute exacerbation.  No thrombus noted on CT. -Obtain echo as above -Check lactic acid level, continue IV fluids overnight -Follow lipid panel -consider addition of aspirin/statin on discharge  Acute kidney injury: Mild AKI suspected related to poor perfusion for possible hypotensive episode +/- medication effect.  Right renal atrophy noted on CT. -Continue IV fluids overnight as above -Hold lisinopril and HCTZ  Hypertension: Holding his antihypertensives due  to potential orthostatic/hypotensive syncopal episode.  Right inguinal hernia: Chronic, no evidence of incarceration/strangulation.  Multiple right renal calculi: Chronic issue, felt to be residual fragments from previously treated staghorn calculus.  DVT prophylaxis: Lovenox Code Status: Full code Family Communication: None available on admission Disposition Plan: Incarcerated, likely return to previous environment on discharge Consults called: None Level of care: Telemetry Cardiac Admission status:  Status is: Observation  The patient remains OBS appropriate and will d/c before 2 midnights.  Dispo: The patient is from: Present              Anticipated d/c is to:  Previous environment              Patient currently is not medically stable to d/c.  Zada Finders MD [facility]  If 7PM-7AM, please contact night-coverage [email or web address]  02/17/2021, 10:09 PM

## 2021-02-18 ENCOUNTER — Observation Stay (HOSPITAL_BASED_OUTPATIENT_CLINIC_OR_DEPARTMENT_OTHER): Payer: BC Managed Care – PPO

## 2021-02-18 DIAGNOSIS — R55 Syncope and collapse: Secondary | ICD-10-CM

## 2021-02-18 DIAGNOSIS — I774 Celiac artery compression syndrome: Secondary | ICD-10-CM | POA: Diagnosis not present

## 2021-02-18 DIAGNOSIS — R079 Chest pain, unspecified: Secondary | ICD-10-CM | POA: Diagnosis not present

## 2021-02-18 DIAGNOSIS — I10 Essential (primary) hypertension: Secondary | ICD-10-CM

## 2021-02-18 DIAGNOSIS — R0789 Other chest pain: Secondary | ICD-10-CM | POA: Diagnosis not present

## 2021-02-18 DIAGNOSIS — K551 Chronic vascular disorders of intestine: Secondary | ICD-10-CM

## 2021-02-18 LAB — CBC
HCT: 45.9 % (ref 39.0–52.0)
Hemoglobin: 15.5 g/dL (ref 13.0–17.0)
MCH: 28.5 pg (ref 26.0–34.0)
MCHC: 33.8 g/dL (ref 30.0–36.0)
MCV: 84.5 fL (ref 80.0–100.0)
Platelets: 186 10*3/uL (ref 150–400)
RBC: 5.43 MIL/uL (ref 4.22–5.81)
RDW: 12.3 % (ref 11.5–15.5)
WBC: 7.9 10*3/uL (ref 4.0–10.5)
nRBC: 0 % (ref 0.0–0.2)

## 2021-02-18 LAB — BASIC METABOLIC PANEL
Anion gap: 13 (ref 5–15)
BUN: 13 mg/dL (ref 6–20)
CO2: 25 mmol/L (ref 22–32)
Calcium: 8.8 mg/dL — ABNORMAL LOW (ref 8.9–10.3)
Chloride: 102 mmol/L (ref 98–111)
Creatinine, Ser: 0.99 mg/dL (ref 0.61–1.24)
GFR, Estimated: >60 mL/min (ref >60)
Glucose, Bld: 122 mg/dL — ABNORMAL HIGH (ref 70–99)
Potassium: 3.7 mmol/L (ref 3.5–5.1)
Sodium: 140 mmol/L (ref 135–145)

## 2021-02-18 LAB — ECHOCARDIOGRAM COMPLETE
AR max vel: 6.72 cm2
AV Area VTI: 8.08 cm2
AV Area mean vel: 5.79 cm2
AV Mean grad: 3 mmHg
AV Peak grad: 6.1 mmHg
Ao pk vel: 1.23 m/s
Height: 68 in
MV M vel: 2.86 m/s
MV Peak grad: 32.7 mmHg
S' Lateral: 2.2 cm
Single Plane A4C EF: 56.8 %
Weight: 2720 oz

## 2021-02-18 LAB — HIV ANTIBODY (ROUTINE TESTING W REFLEX): HIV Screen 4th Generation wRfx: NONREACTIVE

## 2021-02-18 LAB — MAGNESIUM: Magnesium: 1.9 mg/dL (ref 1.7–2.4)

## 2021-02-18 MED ORDER — ATORVASTATIN CALCIUM 40 MG PO TABS: 40.0000 mg | ORAL_TABLET | Freq: Every day | ORAL | 3 refills | Status: AC

## 2021-02-18 MED ORDER — ASPIRIN EC 81 MG PO TBEC: 81.0000 mg | DELAYED_RELEASE_TABLET | Freq: Every day | ORAL | 11 refills | Status: AC

## 2021-02-18 NOTE — Discharge Summary (Signed)
Physician Discharge Summary  Paul Ayers MGQ:[id number] DOB: 07/04/1970 DOA: 02/17/2021  PCP: Pcp, No  Admit date: 02/17/2021 Discharge date: 02/18/2021  Admitted From: [facility]  Disposition:  Jail   Recommendations for Outpatient Follow-up:  Schedule follow up with Cardiology in 2-4 weeks for evaluation of chest pain Obtain fasting Abdomen US with celiac and SMA artery duplex within 1 month Schedule follow up with Vascular Surgery in 6-12 months Start aspirin and statin     Home Health: None  Equipment/Devices: None new  Discharge Condition: Good  CODE STATUS: FULL Diet recommendation: Cardiac  Brief/Interim Summary: [name] is a 50 y.o. M with HTN who presented with chest pain and syncope.  Patient has been in his usual health until 3 nights ago, developed left chest pain while laying down.  This resolved spontanesouly after getting up and drinking a glass of water.  The pain recurred the next night and then the night of admission.  The night of admission, he sought care from the [facility] nurse, was given a nitro, and subsequently passed out and was brought to the ER.  Patient was admitted overnight and observed.  ECG showed early repolarization pattern, no ST changes.  Echocardiogram was obtained, normal EF, no regional wall motion abnormalities, normal valves.  High-sensitivity troponins were normal range.  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     PRINCIPAL HOSPITAL DIAGNOSIS: Atypical chest pain    Discharge Diagnoses:   Atypical chest pain Normal echo, ECG, and troponins.  ACS ruled out.  Reasonable to be evaluated by cardiology for further restratification.    Syncope This appears to be due to the nitro he was given.  It did not recur.  His tele was normal  overnight.   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  Discussed with vascular surgery, they recommend baseline ultrasound with duplex of the abdomen, and prompt evaluation if postprandial pain develops.  Otherwise follow-up in 1 year.  Start aspirin and statin.   Hypertension Continue home meds        Discharge Instructions  Discharge Instructions     Diet - low sodium heart healthy   Complete by: As directed    Discharge instructions   Complete by: As directed    From Dr. Verlee Rossetti were evaluated for chest pain and right abdominal pain.  Here, your EKG was normal (this is the electrical activity of your heart) Your heart enzymes ruled out a heart attack  You had extensive imaging of your chest belly and pelvis, that showed that you have chronic atherosclerosis Atherosclerosis is fatty plaque buildup in the arteries (imagine the pipes in your house are clogged).  Atherosclerosis can lead to problems in the future.    For this reason, I recommend you continue your blood pressure medicines hydrochlorothiazide and lisinopril START aspirin 81 mg daily START atorvastatin 40 mg nightly (this is a cholesterol medicine)  You should be seen by a cardiologist within 4 weeks to evaluate for chest pain.  They should obtain the records from this hospital visit.  I have listed a Cardiologist in the chart for the jail to call for an appoitnment  You were also noticed to have atheroclerosis in your belly arteries.  These are not causing symptoms now, but  if you start to develop pain in your belly after eating, you should tell your doctor  In the next month, you should have an ultrasound of the abdomen In 6-12 months, you should see a vascular surgeon about "celiac artery and superior mesenteric artery stenosis"   Increase activity slowly   Complete by: As directed       Allergies as of 02/18/2021   No Known  Allergies      Medication List     TAKE these medications    aspirin EC 81 MG tablet Take 1 tablet (81 mg total) by mouth daily. Swallow whole.   atorvastatin 40 MG tablet Commonly known as: Lipitor Take 1 tablet (40 mg total) by mouth daily.   hydrochlorothiazide 25 MG tablet Commonly known as: HYDRODIURIL Take 25 mg by mouth daily.   lisinopril 20 MG tablet Commonly known as: ZESTRIL Take 20 mg by mouth daily.        Follow-up Information     Paul Stade, MD Follow up in 4 week(s).   Specialty: Cardiology Contact information: [address] [place] [address] [phone]         Paul Sparrow, MD Follow up in 6 month(s).   Specialty: Vascular Surgery Contact information: [address] [place] [address] [phone]                No Known Allergies    Procedures/Studies: CT HEAD WO CONTRAST ( )  Result Date: 02/17/2021 CLINICAL DATA:  Delirium EXAM: CT HEAD WITHOUT CONTRAST TECHNIQUE: Contiguous axial images were obtained from the base of the skull through the vertex without intravenous contrast. COMPARISON:  None. FINDINGS: Brain: There is no acute intracranial hemorrhage, extra-axial fluid collection, or infarct. The ventricles are normal in size. There is no mass lesion. There is no midline shift. Vascular: No hyperdense vessel or unexpected calcification. Skull: Normal. Negative for fracture or focal lesion. Sinuses/Orbits: There is mucosal thickening in the right sphenoid sinus and left maxillary sinus. The globes and orbits are unremarkable. Other: None. IMPRESSION: No acute intracranial pathology. Electronically Signed   By: Lesia Hausen M.D.   On: 02/17/2021 17:01   ECHOCARDIOGRAM COMPLETE  Result Date: 02/18/2021    ECHOCARDIOGRAM REPORT   Patient Name:   [facility] Date of Exam: 02/18/2021 Medical Rec #:  [id number]               Height:       68.0 in Accession #:    [id number]               Weight:       170.0 lb Date of Birth:  1970-08-20                BSA:          1.907 m Patient Age:    50 years                BP:           122/81 mmHg Patient Gender: M                       HR:           103 bpm. Exam Location:  Inpatient Procedure: 2D Echo, Cardiac Doppler and Color Doppler Indications:    R55 Syncope  History:        Patient has no prior history of Echocardiogram examinations.  Signs/Symptoms:Chest Pain and Syncope.  Sonographer:    MH Referring Phys: [id number] VISHAL R PATEL IMPRESSIONS  1. Left ventricular ejection fraction, by estimation, is 65 to 70%. The left ventricle has normal function. The left ventricle has no regional wall motion abnormalities. Left ventricular diastolic parameters were normal.  2. Right ventricular systolic function is normal. The right ventricular size is normal.  3. The mitral valve is normal in structure. Trivial mitral valve regurgitation. No evidence of mitral stenosis.  4. The aortic valve is tricuspid. Aortic valve regurgitation is not visualized. No aortic stenosis is present.  5. Aortic dilatation noted. There is mild dilatation of the aortic root, measuring 38 mm, when accounting for age and body surface area. Comparison(s): No prior Echocardiogram. FINDINGS  Left Ventricle: Left ventricular ejection fraction, by estimation, is 65 to 70%. The left ventricle has normal function. The left ventricle has no regional wall motion abnormalities. The left ventricular internal cavity size was small. There is no left ventricular hypertrophy. Left ventricular diastolic parameters were normal. Right Ventricle: The right ventricular size is normal. Right vetricular wall thickness was not well visualized. Right ventricular systolic function is normal. Left Atrium: Left atrial size was normal in size. Right Atrium: Right atrial size was normal in size. Pericardium: There is no evidence of pericardial effusion. Mitral Valve: The mitral valve is normal in  structure. Trivial mitral valve regurgitation. No evidence of mitral valve stenosis. Tricuspid Valve: The tricuspid valve is grossly normal. Tricuspid valve regurgitation is not demonstrated. No evidence of tricuspid stenosis. Aortic Valve: The aortic valve is tricuspid. Aortic valve regurgitation is not visualized. No aortic stenosis is present. Aortic valve mean gradient measures 3.0 mmHg. Aortic valve peak gradient measures 6.1 mmHg. Aortic valve area, by VTI measures 8.08 cm. Pulmonic Valve: The pulmonic valve was not well visualized. Pulmonic valve regurgitation is not visualized. No evidence of pulmonic stenosis. Aorta: Aortic dilatation noted. There is mild dilatation of the aortic root, measuring 38 mm. IAS/Shunts: The interatrial septum was not well visualized.  LEFT VENTRICLE PLAX 2D LVIDd:         3.40 cm     Diastology LVIDs:         2.20 cm     LV e' medial:  17.00 cm/s LV PW:         1.40 cm     LV e' lateral: 14.90 cm/s LV IVS:        1.90 cm LVOT diam:     3.00 cm LV SV:         186 LV SV Index:   97 LVOT Area:     7.07 cm  LV Volumes (MOD) LV vol d, MOD A4C: 73.1 ml LV vol s, MOD A4C: 31.6 ml LV SV MOD A4C:     73.1 ml RIGHT VENTRICLE RV S prime:     10.90 cm/s TAPSE (M-mode): 2.8 cm LEFT ATRIUM             Index       RIGHT ATRIUM           Index LA diam:        2.60 cm 1.36 cm/m  RA Area:     11.30 cm LA Vol (A2C):   43.2 ml 22.65 ml/m RA Volume:   24.80 ml  13.00 ml/m LA Vol (A4C):   37.2 ml 19.50 ml/m LA Biplane Vol: 41.7 ml 21.86 ml/m  AORTIC VALVE  PULMONIC VALVE AV Area (Vmax):    6.72 cm    PV Vmax:       0.86 m/s AV Area (Vmean):   5.79 cm    PV Peak grad:  2.9 mmHg AV Area (VTI):     8.08 cm AV Vmax:           123.00 cm/s AV Vmean:          81.700 cm/s AV VTI:            0.230 m AV Peak Grad:      6.1 mmHg AV Mean Grad:      3.0 mmHg LVOT Vmax:         117.00 cm/s LVOT Vmean:        66.900 cm/s LVOT VTI:          0.263 m LVOT/AV VTI ratio: 1.14  AORTA Ao Root  diam: 3.60 cm Ao Asc diam:  3.80 cm MR Peak grad: 32.7 mmHg MR Vmax:      286.00 cm/s SHUNTS                           Systemic VTI:  0.26 m                           Systemic Diam: 3.00 cm Riley Lam MD Electronically signed by Riley Lam MD Signature Date/Time: 02/18/2021/10:01:48 AM    Final    CT Angio Chest/Abd/Pel for Dissection W and/or Wo Contrast  Result Date: 02/17/2021 CLINICAL DATA:  Chest and back pain. EXAM: CT ANGIOGRAPHY CHEST, ABDOMEN AND PELVIS TECHNIQUE: Non-contrast CT of the chest was initially obtained. Multidetector CT imaging through the chest, abdomen and pelvis was performed using the standard protocol during bolus administration of intravenous contrast. Multiplanar reconstructed images and MIPs were obtained and reviewed to evaluate the vascular anatomy. CONTRAST:  OMNIPAQUE IOHEXOL 350 MG/ML SOLN COMPARISON:  March 06, 2010.  Oct 16, 2009.  April 17, 2009. FINDINGS: CTA CHEST FINDINGS Cardiovascular: Preferential opacification of the thoracic aorta. No evidence of thoracic aortic aneurysm or dissection. Normal heart size. No pericardial effusion. Mediastinum/Nodes: No enlarged mediastinal, hilar, or axillary lymph nodes. Thyroid gland, trachea, and esophagus demonstrate no significant findings. Lungs/Pleura: Lungs are clear. No pleural effusion or pneumothorax. Musculoskeletal: No chest wall abnormality. No acute or significant osseous findings. Review of the MIP images confirms the above findings. CTA ABDOMEN AND PELVIS FINDINGS VASCULAR Aorta: No evidence of abdominal aortic aneurysm or dissection is noted. Celiac: Severe stenosis is seen involving the proximal trunk of the celiac artery with poststenotic dilatation. No thrombus is noted. SMA: Severe stenosis is noted involving the proximal trunk of the superior mesenteric artery with poststenotic dilatation. No thrombus is noted. Renals: Left renal artery is widely patent without thrombus dissection or  stenosis. Right renal artery is diffusely diminished in caliber consistent with right renal atrophy. IMA: Patent without evidence of aneurysm, dissection, vasculitis or significant stenosis. Inflow: Patent without evidence of aneurysm, dissection, vasculitis or significant stenosis. Veins: No obvious venous abnormality within the limitations of this arterial phase study. Review of the MIP images confirms the above findings. NON-VASCULAR Hepatobiliary: No gallstones or biliary dilatation is noted. Left hepatic cyst is noted which is enlarged compared to prior exam. Pancreas: Unremarkable. No pancreatic ductal dilatation or surrounding inflammatory changes. Spleen: Normal in size without focal abnormality. Adrenals/Urinary Tract: Adrenal glands appear normal. Left kidney is unremarkable.  Multiple large right renal calculi are noted. Multiple fluid-filled structures are seen involving the right kidney and are replacing most of the parenchyma. It is uncertain if these represent renal cyst, or severe caliectasis due to chronic stenoses related to prior nephrolithiasis. Urinary bladder is unremarkable. Stomach/Bowel: Stomach is within normal limits. Appendix appears normal. No evidence of bowel wall thickening, distention, or inflammatory changes. Lymphatic: No significant adenopathy is noted. Reproductive: Prostate is unremarkable. Other: Large right inguinal hernia is noted which contains fluid. No ascites is noted. Musculoskeletal: No acute or significant osseous findings. Review of the MIP images confirms the above findings. IMPRESSION: No evidence of thoracic or abdominal aortic aneurysm or dissection. Severe stenoses are seen involving the origins of the celiac and superior mesenteric arteries suggesting chronic mesenteric ischemia. 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 Multiple fluid-filled rounded abnormalities are noted concerning for enlarged cysts or  dilated calices secondary to infundibular scarring from prior nephrolithiasis. Large right inguinal hernia is noted which contains fluid. Electronically Signed   By: Lupita Raider M.D.   On: 02/17/2021 17:25      Subjective: The patient's chest and abdominal pain is resolved.  He has had no confusion, syncope, dyspnea, swelling, orthopnea.  Discharge Exam: Vitals:   02/18/21 0730 02/18/21 0745  BP: 109/82 116/84  Pulse: 71 61  Resp: 15 16  Temp:    SpO2: 99% 99%   Vitals:   02/18/21 0700 02/18/21 0715 02/18/21 0730 02/18/21 0745  BP: 102/82 106/90 109/82 116/84  Pulse: 67 80 71 61  Resp: 14 20 15 16   Temp:      TempSrc:      SpO2: 98% 100% 99% 99%  Weight:      Height:        General: Pt is alert, awake, not in acute distress Cardiovascular: RRR, nl S1-S2, no murmurs appreciated.   No LE edema.   Respiratory: Normal respiratory rate and rhythm.  CTAB without rales or wheezes. Abdominal: Abdomen soft and non-tender.  No distension or HSM.   Neuro/Psych: Strength symmetric in upper and lower extremities.  Judgment and insight appear normal.   The results of significant diagnostics from this hospitalization (including imaging, microbiology, ancillary and laboratory) are listed below for reference.     Microbiology: Recent Results (from the past 240 hour(s))  Resp Panel by RT-PCR (Flu A&B, Covid) Nasopharyngeal Swab     Status: None   Collection Time: 02/17/21  3:37 PM   Specimen: Nasopharyngeal Swab; Nasopharyngeal(NP) swabs in vial transport medium  Result Value Ref Range Status   SARS Coronavirus 2 by RT PCR NEGATIVE NEGATIVE Final    Comment: (NOTE) SARS-CoV-2 target nucleic acids are NOT DETECTED.  The SARS-CoV-2 RNA is generally detectable in upper respiratory specimens during the acute phase of infection. The lowest concentration of SARS-CoV-2 viral copies this assay can detect is 138 copies/mL. A negative result does not preclude SARS-Cov-2 infection and  should not be used as the sole basis for treatment or other patient management decisions. A negative result may occur with  improper specimen collection/handling, submission of specimen other than nasopharyngeal swab, presence of viral mutation(s) within the areas targeted by this assay, and inadequate number of viral copies(<138 copies/mL). A negative result must be combined with clinical observations, patient history, and epidemiological information. The expected result is Negative.  Fact Sheet for Patients:  02/19/21  Fact Sheet for Healthcare Providers:  BloggerCourse.com  This test is no t yet  approved or cleared by the [place] and  has been authorized for detection and/or diagnosis of SARS-CoV-2 by FDA under an Emergency Use Authorization (EUA). This EUA will remain  in effect (meaning this test can be used) for the duration of the COVID-19 declaration under Section 564(b)(1) of the Act, 21 U.S.C.section 360bbb-3(b)(1), unless the authorization is terminated  or revoked sooner.       Influenza A by PCR NEGATIVE NEGATIVE Final   Influenza B by PCR NEGATIVE NEGATIVE Final    Comment: (NOTE) The Xpert Xpress SARS-CoV-2/FLU/RSV plus assay is intended as an aid in the diagnosis of influenza from Nasopharyngeal swab specimens and should not be used as a sole basis for treatment. Nasal washings and aspirates are unacceptable for Xpert Xpress SARS-CoV-2/FLU/RSV testing.  Fact Sheet for Patients: BloggerCourse.com  Fact Sheet for Healthcare Providers: SeriousBroker.it  This test is not yet approved or cleared by the [place] FDA and has been authorized for detection and/or diagnosis of SARS-CoV-2 by FDA under an Emergency Use Authorization (EUA). This EUA will remain in effect (meaning this test can be used) for the duration of the COVID-19 declaration  under Section 564(b)(1) of the Act, 21 U.S.C. section 360bbb-3(b)(1), unless the authorization is terminated or revoked.  Performed at [facility], [address], [place], [place] [address]      Labs: BNP (last 3 results) No results for input(s): BNP in the last 8760 hours. Basic Metabolic Panel: Recent Labs  Lab 02/17/21 1535 02/17/21 1546 02/18/21 0455  NA 136 137 140  K 3.5 3.5 3.7  CL 100 102 102  CO2 28  --  25  GLUCOSE 162* 159* 122*  BUN 16 21* 13  CREATININE 1.28* 1.30* 0.99  CALCIUM 8.8*  --  8.8*  MG  --   --  1.9   Liver Function Tests: Recent Labs  Lab 02/17/21 1535  AST 27  ALT 56*  ALKPHOS 57  BILITOT 0.9  PROT 6.9  ALBUMIN 3.6   Recent Labs  Lab 02/17/21 1535  LIPASE 34   Recent Labs  Lab 02/17/21 1544  AMMONIA 25   CBC: Recent Labs  Lab 02/17/21 1535 02/17/21 1546 02/18/21 0409  WBC 9.5  --  7.9  NEUTROABS 5.3  --   --   HGB 15.5 14.6 15.5  HCT 45.8 43.0 45.9  MCV 84.5  --  84.5  PLT 170  --  186   Cardiac Enzymes: No results for input(s): CKTOTAL, CKMB, CKMBINDEX, TROPONINI in the last 168 hours. BNP: Invalid input(s): POCBNP CBG: No results for input(s): GLUCAP in the last 168 hours. D-Dimer No results for input(s): DDIMER in the last 72 hours. Hgb A1c Recent Labs    02/17/21 1535  HGBA1C 5.8*   Lipid Profile No results for input(s): CHOL, HDL, LDLCALC, TRIG, CHOLHDL, LDLDIRECT in the last 72 hours. Thyroid function studies No results for input(s): TSH, T4TOTAL, T3FREE, THYROIDAB in the last 72 hours.  Invalid input(s): FREET3 Anemia work up No results for input(s): VITAMINB12, FOLATE, FERRITIN, TIBC, IRON, RETICCTPCT in the last 72 hours. Urinalysis    Component Value Date/Time   COLORURINE YELLOW 02/17/2021 1917   APPEARANCEUR CLEAR 02/17/2021 1917   LABSPEC 1.035 (H) 02/17/2021 1917   PHURINE 6.0 02/17/2021 1917   GLUCOSEU NEGATIVE 02/17/2021 1917   HGBUR NEGATIVE 02/17/2021 1917   BILIRUBINUR  NEGATIVE 02/17/2021 1917   KETONESUR NEGATIVE 02/17/2021 1917   PROTEINUR NEGATIVE 02/17/2021 1917   UROBILINOGEN 0.2 03/06/2010  5329   NITRITE NEGATIVE 02/17/2021 1917   LEUKOCYTESUR NEGATIVE 02/17/2021 1917   Sepsis Labs Invalid input(s): PROCALCITONIN,  WBC,  LACTICIDVEN Microbiology Recent Results (from the past 240 hour(s))  Resp Panel by RT-PCR (Flu A&B, Covid) Nasopharyngeal Swab     Status: None   Collection Time: 02/17/21  3:37 PM   Specimen: Nasopharyngeal Swab; Nasopharyngeal(NP) swabs in vial transport medium  Result Value Ref Range Status   SARS Coronavirus 2 by RT PCR NEGATIVE NEGATIVE Final    Comment: (NOTE) SARS-CoV-2 target nucleic acids are NOT DETECTED.  The SARS-CoV-2 RNA is generally detectable in upper respiratory specimens during the acute phase of infection. The lowest concentration of SARS-CoV-2 viral copies this assay can detect is 138 copies/mL. A negative result does not preclude SARS-Cov-2 infection and should not be used as the sole basis for treatment or other patient management decisions. A negative result may occur with  improper specimen collection/handling, submission of specimen other than nasopharyngeal swab, presence of viral mutation(s) within the areas targeted by this assay, and inadequate number of viral copies(<138 copies/mL). A negative result must be combined with clinical observations, patient history, and epidemiological information. The expected result is Negative.  Fact Sheet for Patients:  BloggerCourse.com  Fact Sheet for Healthcare Providers:  SeriousBroker.it  This test is no t yet approved or cleared by the [place] FDA and  has been authorized for detection and/or diagnosis of SARS-CoV-2 by FDA under an Emergency Use Authorization (EUA). This EUA will remain  in effect (meaning this test can be used) for the duration of the COVID-19 declaration under Section  564(b)(1) of the Act, 21 U.S.C.section 360bbb-3(b)(1), unless the authorization is terminated  or revoked sooner.       Influenza A by PCR NEGATIVE NEGATIVE Final   Influenza B by PCR NEGATIVE NEGATIVE Final    Comment: (NOTE) The Xpert Xpress SARS-CoV-2/FLU/RSV plus assay is intended as an aid in the diagnosis of influenza from Nasopharyngeal swab specimens and should not be used as a sole basis for treatment. Nasal washings and aspirates are unacceptable for Xpert Xpress SARS-CoV-2/FLU/RSV testing.  Fact Sheet for Patients: BloggerCourse.com  Fact Sheet for Healthcare Providers: SeriousBroker.it  This test is not yet approved or cleared by the [place] FDA and has been authorized for detection and/or diagnosis of SARS-CoV-2 by FDA under an Emergency Use Authorization (EUA). This EUA will remain in effect (meaning this test can be used) for the duration of the COVID-19 declaration under Section 564(b)(1) of the Act, 21 U.S.C. section 360bbb-3(b)(1), unless the authorization is terminated or revoked.  Performed at [facility], [address], [place], [place] [address]      Time coordinating discharge: 35 minutes          SIGNED:   Alberteen Sam, MD  [facility] 02/18/2021, 10:56 AM

## 2021-10-06 IMAGING — CT CT HEAD W/O CM
4 series · 17 of 47 positions shown, 19 images · non-contrast
Comparison: None.

CLINICAL DATA: Delirium

EXAM:
CT HEAD WITHOUT CONTRAST
TECHNIQUE: Contiguous axial images were obtained from the base of the skull
through the vertex without intravenous contrast.

[Series 3: head bone · axial · 0.42mm/px · z∈[+1303,+1361]mm · 4 of 85 slices shown]
[im 9/85  bone]
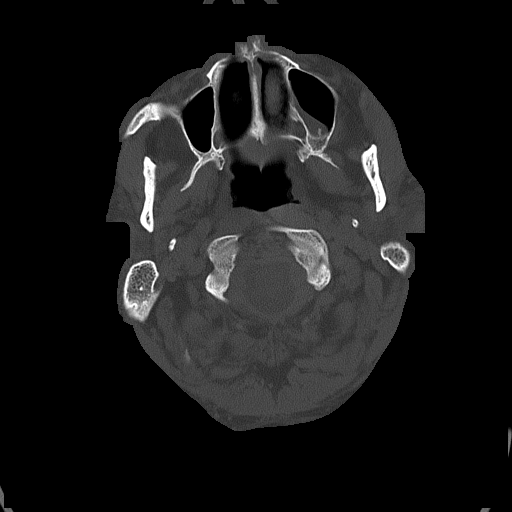
[im 17/85  bone]
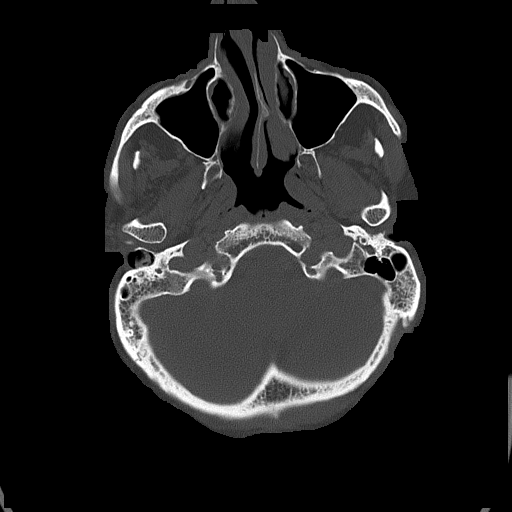
[im 26/85  bone]
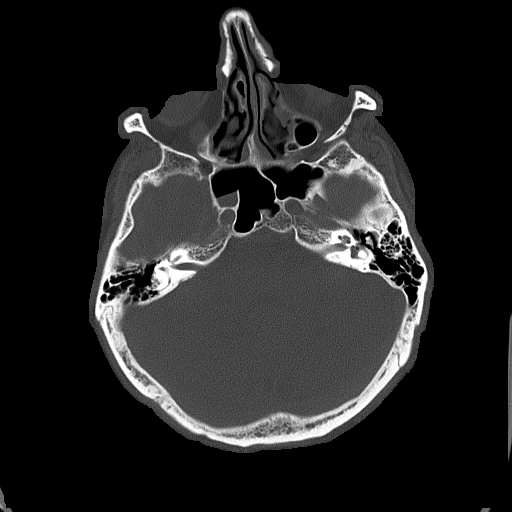
[im 38/85  bone]
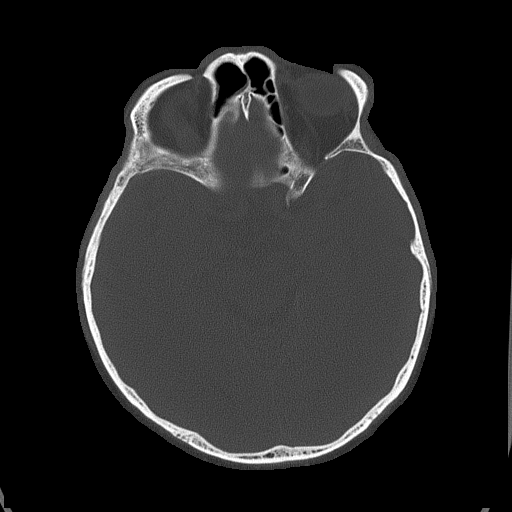

[Series 4: head without · axial · non-contrast · 0.42mm/px · z∈[+1307,+1427]mm · 7 of 34 slices shown, 9 images]
[im 5/34  brain]
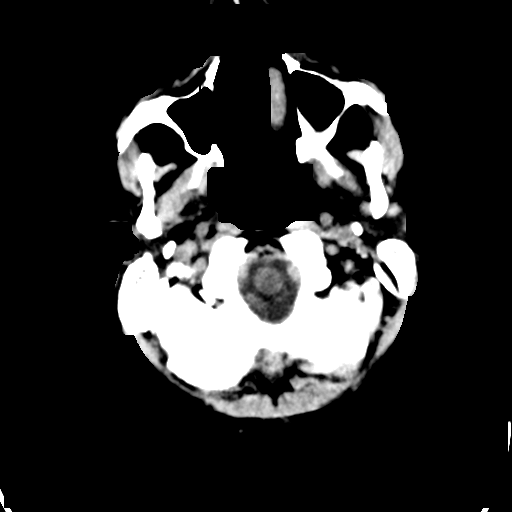
[im 5/34  bone]
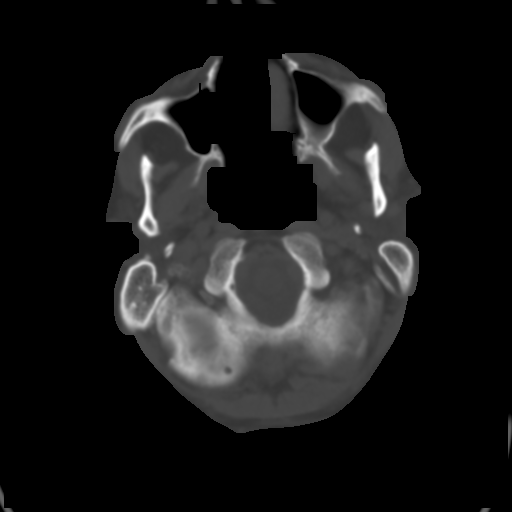
[im 9/34  brain]
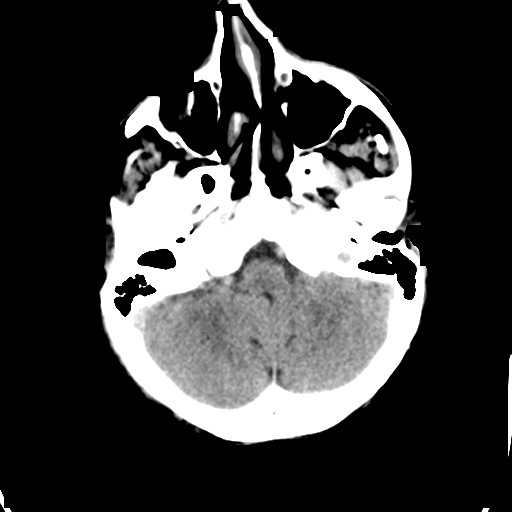
[im 13/34  brain]
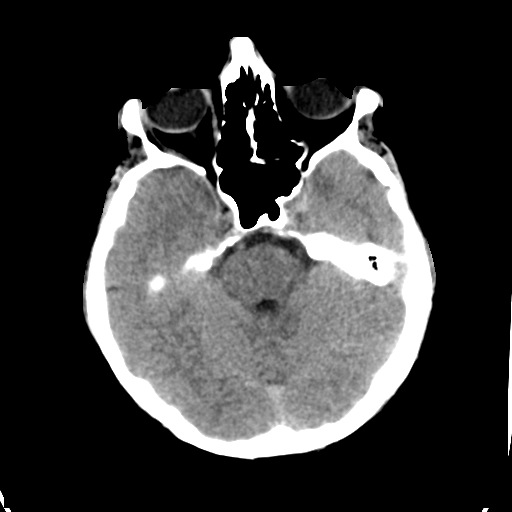
[im 17/34  brain]
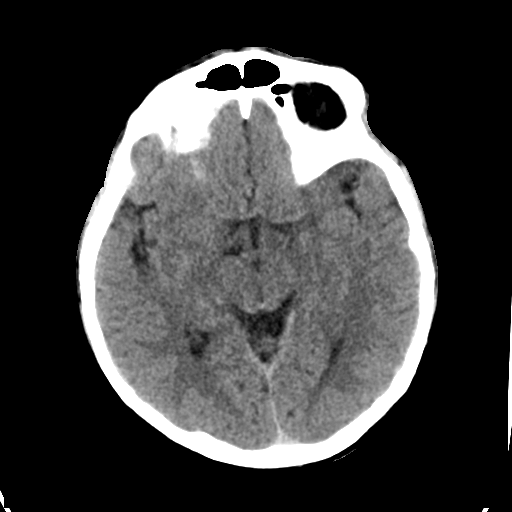
[im 21/34  brain]
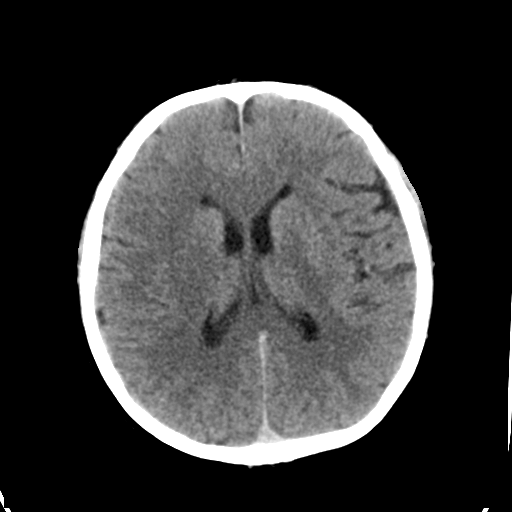
[im 21/34  bone]
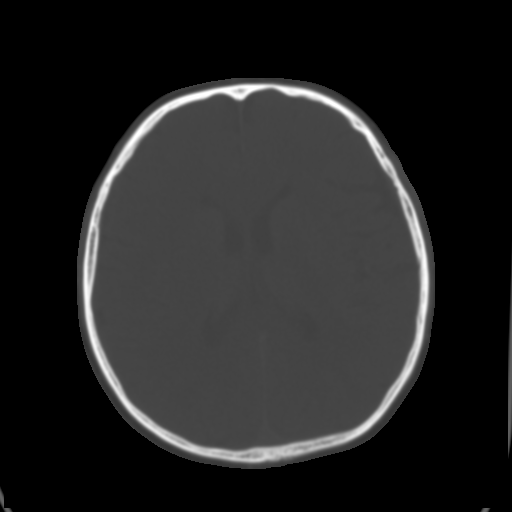
[im 25/34  brain]
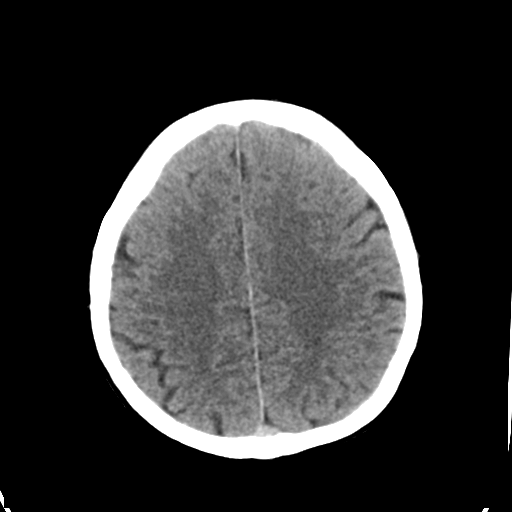
[im 29/34  brain]
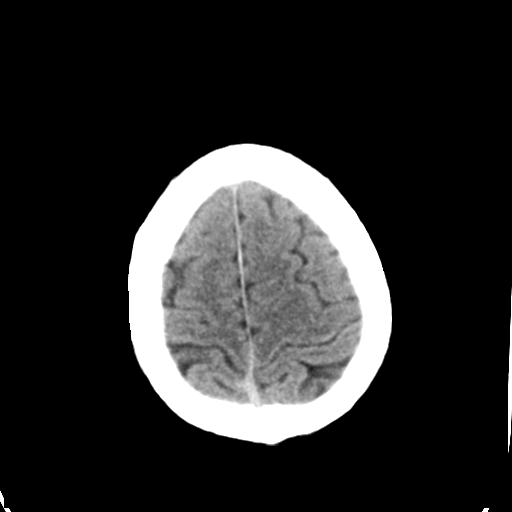

[Series 5: head without cor · coronal · non-contrast · 0.36mm/px · 3 of 67 slices shown]
[im 23/67  brain]
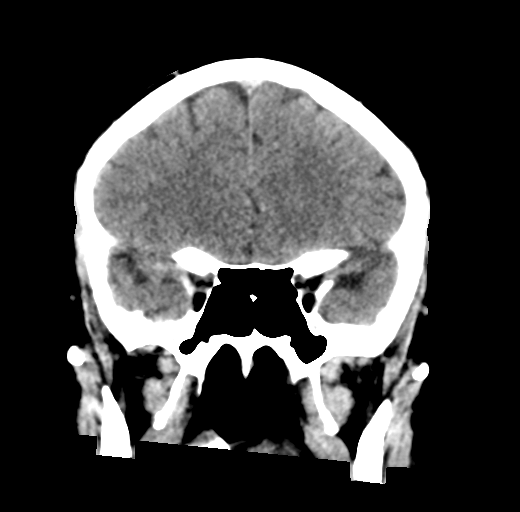
[im 30/67  brain]
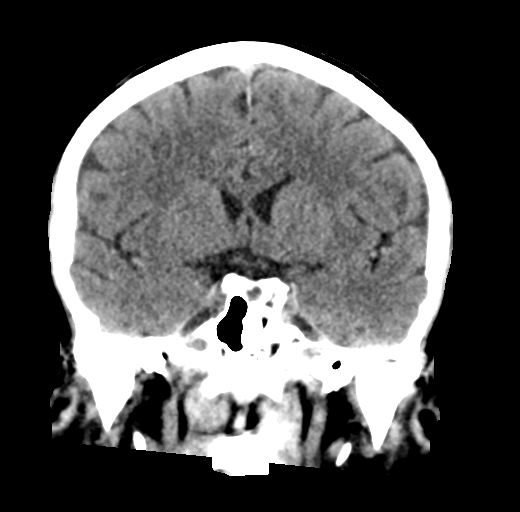
[im 37/67  brain]
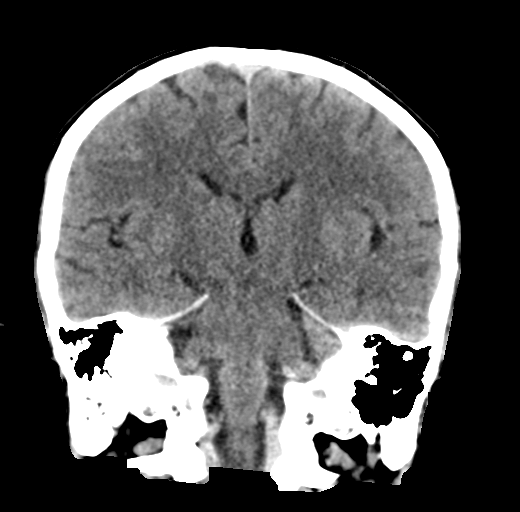

[Series 6: head without sag · sagittal · non-contrast · 0.33mm/px · 3 of 53 slices shown]
[im 18/53  brain]
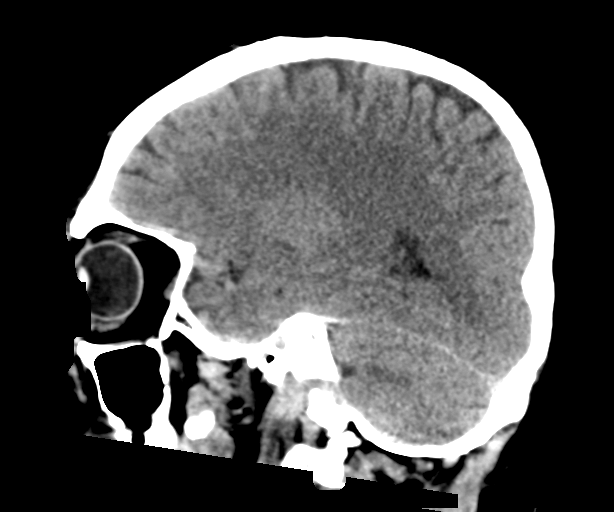
[im 27/53  brain]
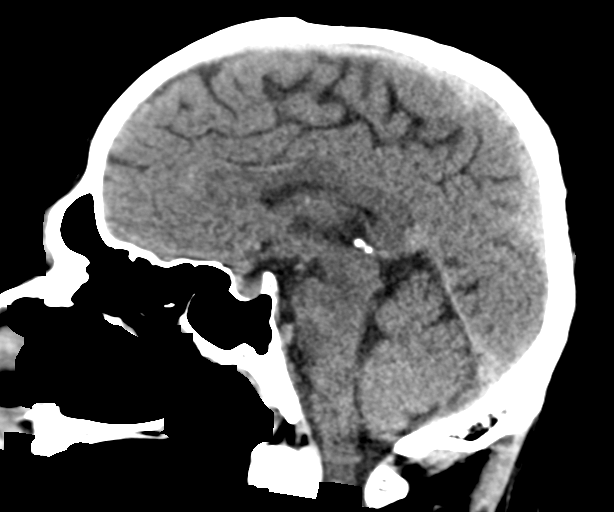
[im 35/53  brain]
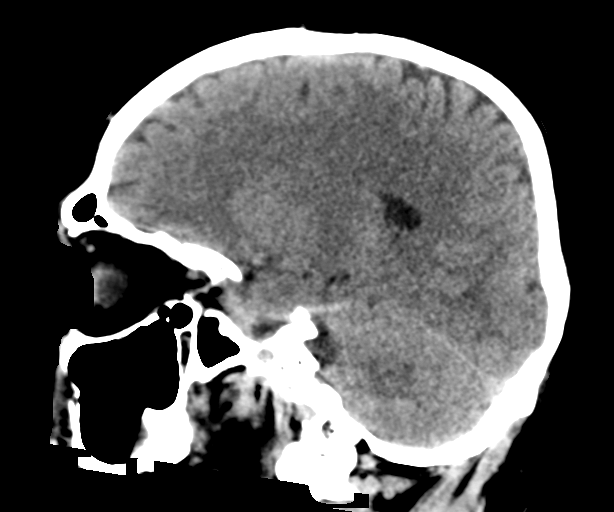

[17 of 47 positions shown; findings below may reference images not displayed]

FINDINGS: Brain: There is no acute intracranial hemorrhage, extra-axial fluid
collection, or infarct. The ventricles are normal in size. There is
no mass lesion. There is no midline shift.

Vascular: No hyperdense vessel or unexpected calcification.

Skull: Normal. Negative for fracture or focal lesion.

Sinuses/Orbits: There is mucosal thickening in the right sphenoid
sinus and left maxillary sinus. The globes and orbits are
unremarkable.

Other: None.
IMPRESSION: No acute intracranial pathology.

## 2022-06-12 DIAGNOSIS — D649 Anemia, unspecified: Secondary | ICD-10-CM | POA: Insufficient documentation

## 2022-08-21 DIAGNOSIS — R809 Proteinuria, unspecified: Secondary | ICD-10-CM | POA: Insufficient documentation

## 2022-10-06 ENCOUNTER — Telehealth: Payer: Self-pay

## 2022-10-06 NOTE — Telephone Encounter (Signed)
No show for PV 10/06/22. Pt will be notified to r/s via no show letter sent to address on file

## 2022-10-20 ENCOUNTER — Encounter: Payer: BC Managed Care – PPO | Admitting: Gastroenterology
